# Patient Record
Sex: Female | Born: 1950 | Race: White | Hispanic: No | Smoking: Never smoker
Health system: Southern US, Community
[De-identification: ages and names within clinical notes are randomized; demographics above are authoritative.]

## PROBLEM LIST (undated history)

## (undated) DIAGNOSIS — R413 Other amnesia: Secondary | ICD-10-CM

## (undated) HISTORY — DX: Other amnesia: R41.3

---

## 2011-01-29 ENCOUNTER — Emergency Department (INDEPENDENT_AMBULATORY_CARE_PROVIDER_SITE_OTHER): Payer: No Typology Code available for payment source

## 2011-01-29 ENCOUNTER — Emergency Department (HOSPITAL_BASED_OUTPATIENT_CLINIC_OR_DEPARTMENT_OTHER)
Admission: EM | Admit: 2011-01-29 | Discharge: 2011-01-30 | Disposition: A | Discharge status: Home / Self Care | Payer: Self-pay | Attending: Emergency Medicine | Admitting: Emergency Medicine

## 2011-01-29 DIAGNOSIS — S139XXA Sprain of joints and ligaments of unspecified parts of neck, initial encounter: Secondary | ICD-10-CM | POA: Insufficient documentation

## 2011-01-29 DIAGNOSIS — M542 Cervicalgia: Secondary | ICD-10-CM | POA: Insufficient documentation

## 2011-01-29 DIAGNOSIS — M5030 Other cervical disc degeneration, unspecified cervical region: Secondary | ICD-10-CM

## 2011-01-29 DIAGNOSIS — Y92410 Unspecified street and highway as the place of occurrence of the external cause: Secondary | ICD-10-CM | POA: Insufficient documentation

## 2011-01-29 DIAGNOSIS — M47812 Spondylosis without myelopathy or radiculopathy, cervical region: Secondary | ICD-10-CM

## 2012-09-16 ENCOUNTER — Other Ambulatory Visit: Payer: Self-pay | Admitting: Family Medicine

## 2012-09-16 ENCOUNTER — Other Ambulatory Visit (HOSPITAL_COMMUNITY)
Admission: RE | Admit: 2012-09-16 | Discharge: 2012-09-16 | Disposition: A | Discharge status: Home / Self Care | Payer: BC Managed Care – PPO | Source: Ambulatory Visit | Attending: Family Medicine | Admitting: Family Medicine

## 2012-09-16 DIAGNOSIS — Z113 Encounter for screening for infections with a predominantly sexual mode of transmission: Secondary | ICD-10-CM | POA: Insufficient documentation

## 2012-09-16 DIAGNOSIS — Z124 Encounter for screening for malignant neoplasm of cervix: Secondary | ICD-10-CM | POA: Insufficient documentation

## 2012-09-16 DIAGNOSIS — N760 Acute vaginitis: Secondary | ICD-10-CM | POA: Insufficient documentation

## 2012-12-05 ENCOUNTER — Other Ambulatory Visit: Payer: Self-pay | Admitting: Family Medicine

## 2012-12-05 DIAGNOSIS — Z1231 Encounter for screening mammogram for malignant neoplasm of breast: Secondary | ICD-10-CM

## 2013-01-11 ENCOUNTER — Ambulatory Visit: Payer: BC Managed Care – PPO

## 2013-01-18 ENCOUNTER — Ambulatory Visit
Admission: RE | Admit: 2013-01-18 | Discharge: 2013-01-18 | Disposition: A | Discharge status: Home / Self Care | Payer: BC Managed Care – PPO | Source: Ambulatory Visit | Attending: Family Medicine | Admitting: Family Medicine

## 2013-01-18 DIAGNOSIS — Z1231 Encounter for screening mammogram for malignant neoplasm of breast: Secondary | ICD-10-CM

## 2013-12-11 ENCOUNTER — Other Ambulatory Visit: Payer: Self-pay | Admitting: Family Medicine

## 2013-12-11 ENCOUNTER — Other Ambulatory Visit (HOSPITAL_COMMUNITY)
Admission: RE | Admit: 2013-12-11 | Discharge: 2013-12-11 | Disposition: A | Discharge status: Home / Self Care | Payer: BC Managed Care – PPO | Source: Ambulatory Visit | Attending: Family Medicine | Admitting: Family Medicine

## 2013-12-11 DIAGNOSIS — Z01419 Encounter for gynecological examination (general) (routine) without abnormal findings: Secondary | ICD-10-CM | POA: Insufficient documentation

## 2014-01-17 ENCOUNTER — Telehealth: Payer: Self-pay | Admitting: Neurology

## 2014-01-17 ENCOUNTER — Encounter: Payer: Self-pay | Admitting: Neurology

## 2014-01-17 ENCOUNTER — Ambulatory Visit (INDEPENDENT_AMBULATORY_CARE_PROVIDER_SITE_OTHER): Payer: BC Managed Care – PPO | Admitting: Neurology

## 2014-01-17 VITALS — BP 112/70 | HR 73 | Ht 60.0 in | Wt 105.1 lb

## 2014-01-17 DIAGNOSIS — R413 Other amnesia: Secondary | ICD-10-CM
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
## 2014-01-17 NOTE — Telephone Encounter (Signed)
Pt was seen today and would like to know if there is a support group for Alzheimers please call her at [phone] and she states that you can leave  Message for her if she does not answer

## 2014-01-17 NOTE — Patient Instructions (Addendum)
1. Continue Aricept 10mg  daily 2. We will obtain disc of MRI brain and bloodwork done by your previous neurologist 3. Consider urology evaluation 4. Physical and brain stimulation exercises for brain health 5. Social work consult 6. Ref to [facility] Rehab for neuropsychological evaluation

## 2014-01-18 NOTE — Telephone Encounter (Signed)
Information and pamphlet mailed to patient

## 2014-04-02 DIAGNOSIS — R413 Other amnesia: Secondary | ICD-10-CM | POA: Diagnosis not present

## 2014-04-16 ENCOUNTER — Encounter: Payer: Self-pay | Admitting: Neurology

## 2014-04-16 ENCOUNTER — Ambulatory Visit (INDEPENDENT_AMBULATORY_CARE_PROVIDER_SITE_OTHER): Payer: BC Managed Care – PPO | Admitting: Neurology

## 2014-04-16 VITALS — BP 132/72 | HR 74 | Ht 60.0 in | Wt 102.0 lb

## 2014-04-16 DIAGNOSIS — F0390 Unspecified dementia without behavioral disturbance: Secondary | ICD-10-CM

## 2014-04-16 DIAGNOSIS — F03A0 Unspecified dementia, mild, without behavioral disturbance, psychotic disturbance, mood disturbance, and anxiety: Secondary | ICD-10-CM | POA: Insufficient documentation

## 2014-04-16 NOTE — Patient Instructions (Signed)
1. Continue Aricept 10mg  daily 2. Physical and brain stimulation exercises are important for brain health 3. Have open communication lines with family 4. Follow-up 6 months
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
## 2014-07-12 ENCOUNTER — Other Ambulatory Visit: Payer: Self-pay

## 2014-07-12 DIAGNOSIS — Z1231 Encounter for screening mammogram for malignant neoplasm of breast: Secondary | ICD-10-CM

## 2014-07-30 ENCOUNTER — Ambulatory Visit
Admission: RE | Admit: 2014-07-30 | Discharge: 2014-07-30 | Disposition: A | Discharge status: Home / Self Care | Payer: BC Managed Care – PPO | Source: Ambulatory Visit

## 2014-07-30 DIAGNOSIS — Z1231 Encounter for screening mammogram for malignant neoplasm of breast: Secondary | ICD-10-CM

## 2014-10-15 ENCOUNTER — Ambulatory Visit: Payer: BC Managed Care – PPO | Admitting: Neurology

## 2014-10-23 ENCOUNTER — Ambulatory Visit (INDEPENDENT_AMBULATORY_CARE_PROVIDER_SITE_OTHER): Payer: 59 | Admitting: Neurology

## 2014-10-23 ENCOUNTER — Encounter: Payer: Self-pay | Admitting: Neurology

## 2014-10-23 VITALS — BP 122/82 | HR 68 | Resp 16 | Ht 60.0 in | Wt 107.0 lb

## 2014-10-23 DIAGNOSIS — F03B18 Unspecified dementia, moderate, with other behavioral disturbance: Secondary | ICD-10-CM

## 2014-10-23 DIAGNOSIS — F0391 Unspecified dementia with behavioral disturbance: Secondary | ICD-10-CM

## 2014-10-23 MED ORDER — MEMANTINE HCL ER 7 & 14 & 21 &28 MG PO CP24: ORAL_CAPSULE | ORAL | Status: DC

## 2014-10-23 NOTE — Patient Instructions (Signed)
1. Continue Aricept 2. Start Namenda XR starter pack, call our office for any problems 3. Discuss Education officer, museum with your Family Doctor
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
## 2014-10-29 ENCOUNTER — Encounter: Payer: Self-pay | Admitting: Neurology

## 2015-08-21 ENCOUNTER — Ambulatory Visit
Admission: RE | Admit: 2015-08-21 | Discharge: 2015-08-21 | Disposition: A | Discharge status: Home / Self Care | Payer: Medicare Other | Source: Ambulatory Visit | Attending: Family Medicine | Admitting: Family Medicine

## 2015-08-21 ENCOUNTER — Other Ambulatory Visit: Payer: Self-pay | Admitting: Family Medicine

## 2015-08-21 DIAGNOSIS — R52 Pain, unspecified: Secondary | ICD-10-CM

## 2015-09-09 ENCOUNTER — Ambulatory Visit: Payer: Medicare Other

## 2015-09-12 ENCOUNTER — Ambulatory Visit: Payer: PPO | Attending: Family Medicine

## 2015-09-12 ENCOUNTER — Ambulatory Visit: Payer: Medicare Other

## 2015-09-12 DIAGNOSIS — M546 Pain in thoracic spine: Secondary | ICD-10-CM | POA: Diagnosis not present

## 2015-09-12 DIAGNOSIS — R293 Abnormal posture: Secondary | ICD-10-CM | POA: Diagnosis not present

## 2015-09-12 DIAGNOSIS — M545 Low back pain, unspecified: Secondary | ICD-10-CM

## 2015-09-12 DIAGNOSIS — R262 Difficulty in walking, not elsewhere classified: Secondary | ICD-10-CM

## 2015-09-12 NOTE — Therapy (Signed)
[facility] [address], [place], [place], [address] Phone: [phone]   Fax:  [phone]  Physical Therapy Evaluation  Patient Details  Name: Colleen Lamb MRN: [id number] Date of Birth: Apr 03, 1951 Referring Provider: Cari Caraway, MD  Encounter Date: 09/12/2015      PT End of Session - 09/12/15 1142    Visit Number 1   Number of Visits 10   Date for PT Re-Evaluation 11/07/15   PT Start Time [id number]   PT Stop Time 1143   PT Time Calculation (min) 38 min   Activity Tolerance Patient tolerated treatment well   Behavior During Therapy [facility] for tasks assessed/performed      Past Medical History  Diagnosis Date  . Memory loss     History reviewed. No pertinent past surgical history.  There were no vitals filed for this visit.  Visit Diagnosis:  Bilateral thoracic back pain - Plan: PT plan of care cert/re-cert  Bilateral low back pain without sciatica - Plan: PT plan of care cert/re-cert  Difficulty walking - Plan: PT plan of care cert/re-cert  Posture abnormality - Plan: PT plan of care cert/re-cert      Subjective Assessment - 09/12/15 1140    Subjective              [facility] PT Assessment - 09/12/15 0001    Assessment   Medical Diagnosis thoracic back pain (M54.6)   Referring Provider Cari Caraway, MD   Onset Date/Surgical Date 08/12/15   Prior Therapy none   Precautions   Precautions Other (comment);Fall  osteporosis-no joint mobs, dementia-memory loss   Restrictions   Weight Bearing Restrictions No   Balance Screen   Has the patient fallen in the past 6 months No   Has the patient had a decrease in activity level because of a fear of falling?  No   Is the patient reluctant to leave their home because of a fear of falling?  No   Home Environment   Living Environment Private residence   [place] Access Level entry   [place] One level   Prior Function    Level of [place] Retired   Leisure none   Cognition   Overall Cognitive Status History of cognitive impairments - at baseline   Memory Impaired   Observation/Other Assessments   Focus on Therapeutic Outcomes (FOTO)  66% limitation   Posture/Postural Control   Posture/Postural Control Postural limitations   Postural Limitations Rounded Shoulders;Forward head   ROM / Strength   AROM / PROM / Strength AROM;PROM;Strength   AROM   Overall AROM  Deficits   Overall AROM Comments Lumbar AROM limited by 25% with pain reported in the lower thoracic spine, cervical AROM is limited by 25% into rotation and 50% limitation in flexion and sidebending.  Pt reports upper thoracic pain with end range of all cervical AROM.  UE AROM limited by 25% bilaterally.   PROM   Overall PROM  Deficits   Overall PROM Comments bil. hip flexibility limited by 25% in all driections   Strength   Overall Strength Deficits   Overall Strength Comments UE strength is 4 to 4+/5 and LE strength 4/5 throughout.   Palpation   Spinal mobility not able to assess in prone as pt not able to tolerate this position   Palpation comment tension and active trigger points in bil. UT and bil. thoracic parapsinals T2-5   Transfers  Transfers Sit to Stand;Stand to Sit   Sit to Stand 6: Modified independent (Device/Increase time);With upper extremity assist;With armrests   Ambulation/Gait   Ambulation/Gait Yes   Ambulation/Gait Assistance 6: Modified independent (Device/Increase time)   Ambulation Distance (Feet) 100 Feet   Gait Pattern Step-through pattern;Decreased trunk rotation;Trunk flexed   Ambulation Surface Level   Gait Comments instability upon standing and slow gait velocity                           PT Education - 09/12/15 1135    Education provided Yes   Education Details HEP: cervical AROM and posture education   Person(s) Educated Patient;Other (comment)   Methods  Explanation;Demonstration;Handout   Comprehension Verbalized understanding;Returned demonstration          PT Short Term Goals - 09/12/15 1153    PT SHORT TERM GOAL #1   Title be independent in initial HEP   Time 4   Period Weeks   Status New   PT SHORT TERM GOAL #2   Title verbalize and demonstrate understanding of dos and dont's of osteoporosis   Time 4   Period Weeks   Status New   PT SHORT TERM GOAL #3   Title report a 25% reduction in LBP/thoracic pain with standing and walking   Time 4   Period Weeks   Status New           PT Long Term Goals - 09/12/15 1100    PT LONG TERM GOAL #1   Title be independent in advanced HEP   Time 8   Period Weeks   Status New   PT LONG TERM GOAL #2   Title reduce FOTO to < or = to 45% limitation   Time 8   Period Weeks   Status New   PT LONG TERM GOAL #3   Title reduce pain to allow for walking for 15 minutes to improve community independence   Time 8   Period Weeks   Status New   PT LONG TERM GOAL #4   Title report a 50% reduction in LBP and thoracic pain with standing and walking   Time 8   Period Weeks   Status New   PT LONG TERM GOAL #5   Title sleep without interruption due to pain   Time 8   Period Weeks   Status New   Additional Long Term Goals   Additional Long Term Goals Yes   PT LONG TERM GOAL #6   Title demonstrate stability upon standing to improve safety with ambulation   Time 8   Period Weeks   Status New               Plan - 09/12/15 1148    Clinical Impression Statement Pt presents to PT with one month history of thoracic/lumbar pain that began without cause.  Pt has osteoporosis with t score -3.0.  Pt demonstrates poor posture, limited standing and walking due to pain, limited cervical and lumbar AROM and UE/LE weakness throughout. Pt also demonstrates instabiltiy with gait on level surface. Pt will benefit from skilled PT for osteporosis education, balance training, postural strength and  manual/modalities as needed for pain.     Pt will benefit from skilled therapeutic intervention in order to improve on the following deficits Abnormal gait;Decreased range of motion;Pain;Postural dysfunction;Decreased strength;Decreased mobility;Decreased balance;Impaired flexibility;Improper body mechanics;Decreased activity tolerance;Decreased endurance   Rehab Potential Good   PT Frequency 2x /  week   PT Duration 8 weeks   PT Treatment/Interventions ADLs/Self Care Home Management;Cryotherapy;Electrical Stimulation;Moist Heat;Therapeutic exercise;Therapeutic activities;Functional mobility training;Stair training;Ultrasound;Neuromuscular re-education;Patient/family education;Manual techniques;Taping;Dry needling;Passive range of motion   PT Next Visit Plan osteoporosis education/body mechanics, modalities for pain, postural strength, test TUG or Berg for balance assessment. Balance training if MD signs iniital evaluation.   Recommended Other Services Balance training    Consulted and Agree with Plan of Care Patient;Family member/caregiver          G-Codes - 10-05-15 1100    Functional Assessment Tool Used FOTO 66% limitation   Functional Limitation Other PT primary   Other PT Primary Current Status [id number]) At least 60 percent but less than 80 percent impaired, limited or restricted   Other PT Primary Goal Status [id number]) At least 40 percent but less than 60 percent impaired, limited or restricted       Problem List Patient Active Problem List   Diagnosis Date Noted  . 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 Memory loss 01/17/2014    TAKACS,KELLY, PT Oct 05, 2015, 12:00 PM  [facility] [address], [place], [place], [address] Phone: [phone]   Fax:  [phone]  Name: Colleen Lamb MRN: [id number] Date of Birth: Aug 20, 1951

## 2015-09-12 NOTE — Patient Instructions (Signed)
PERFORM ALL EXERCISES GENTLY AND WITH GOOD POSTURE.    20 SECOND HOLD, 3 REPS TO EACH SIDE. 4-5 TIMES EACH DAY.   AROM: Neck Rotation   Turn head slowly to look over one shoulder, then the other.   AROM: Neck Flexion   Bend head forward.   AROM: Lateral Neck Flexion   Slowly tilt head toward one shoulder, then the other.    Posture - Standing   Good posture is important. Avoid slouching and forward head thrust. Maintain curve in low back and align ears over shoulders, hips over ankles.  Pull your belly button in toward your back bone. Posture Tips DO: - stand tall and erect - keep chin tucked in - keep head and shoulders in alignment - check posture regularly in mirror or large window - pull head back against headrest in car seat;  Change your position often.  Sit with lumbar support. DON'T: - slouch or slump while watching TV or reading - sit, stand or lie in one position  for too long;  Sitting is especially hard on the spine so if you sit at a desk/use the computer, then stand up often! Copyright  VHI. All rights reserved.  Posture - Sitting  Sit upright, head facing forward. Try using a roll to support lower back. Keep shoulders relaxed, and avoid rounded back. Keep hips level with knees. Avoid crossing legs for long periods. Copyright  VHI. All rights reserved.  Chronic neck strain can develop because of poor posture and faulty work habits  Postural strain related to slumped sitting and forward head posture is a leading cause of headaches, neck and upper back pain  General strengthening and flexibility exercises are helpful in the treatment of neck pain.  Most importantly, you should learn to correct the posture that may be contributing to chronic pain.   Change positions frequently  Change your work or home environment to improve posture and mechanics.   [facility] [address], [address] [place], [place] [address] Phone # [phone] Fax  [phone] 

## 2015-09-16 ENCOUNTER — Ambulatory Visit: Payer: PPO

## 2015-09-16 DIAGNOSIS — R293 Abnormal posture: Secondary | ICD-10-CM

## 2015-09-16 DIAGNOSIS — M545 Low back pain, unspecified: Secondary | ICD-10-CM

## 2015-09-16 DIAGNOSIS — R262 Difficulty in walking, not elsewhere classified: Secondary | ICD-10-CM

## 2015-09-16 DIAGNOSIS — M546 Pain in thoracic spine: Secondary | ICD-10-CM

## 2015-09-16 NOTE — Patient Instructions (Signed)
DO's and DON'T's   Avoid and/or Minimize positions of forward bending ( flexion)  Side bending and rotation of the trunk  Especially when movements occur together   When your back aches:   Don't sit down   Lie down on your back with a small pillow under your head and one under your knees or as outlined by our therapist. Or, lie in the 90/90 position ( on the floor with your feet and legs on the sofa with knees and hips bent to 90 degrees)  Tying or putting on your shoes:   Don't bend over to tie your shoes or put on socks.  Instead, bring one foot up, cross it over the opposite knee and bend forward (hinge) at the hips to so the task.  Keep your back straight.  If you cannot do this safely, then you need to use long handled assistive devices such as a shoehorn and sock puller.  Exercising:  Don't engage in ballistic types of exercise routines such as high-impact aerobics or jumping rope  Don't do exercises in the gym that bring you forward (abdominal crunches, sit-ups, touching your  toes, knee-to-chest, straight leg raising.)  Follow a regular exercise program that includes a variety of different weight-bearing activities, such as low-impact aerobics, T' ai chi or walking as your physical therapist advises  Do exercises that emphasize return to normal body alignment and strengthening of the muscles that keep your back straight, as outlined in this program or by your therapist  Household tasks:  Don't reach unnecessarily or twist your trunk when mopping, sweeping, vacuuming, raking, making beds, weeding gardens, getting objects ou of cupboards, etc.  Keep your broom, mop, vacuum, or rake close to you and mover your whole body as you move them. Walk over to the area on which you are working. Arrange kitchen, bathroom, and bedroom shelves so that frequently used items may be reached without excessive bending, twisting, and reaching.  Use a  sturdy stool if necessary.  Don't bend from the waist to pick up something up  Off the floor, out of the trunk of your car, or to brush your teeth, wash your face, etc.   Bend at the knees, keeping back straight as possible. Use a reacher if necessary.   Prevention of fracture is the so-called "BOTTOm -Line" in the management of OSTEOPOROSIS. Do not take unnecessary chances in movement. Once a compression fracture occurs, the process is very difficult to control; one fracture is frequently followed by many more.      Lifting Principles  .Maintain proper posture and head alignment. .Slide object as close as possible before lifting. .Move obstacles out of the way. .Test before lifting; ask for help if too heavy. .Tighten stomach muscles without holding breath. .Use smooth movements; do not jerk. .Use legs to do the work, and pivot with feet. .Distribute the work load symmetrically and close to the center of trunk. .Push instead of pull whenever possible.   Squat down and hold basket close to stand. Use leg muscles to do the work.    Avoid twisting or bending back. Pivot around using foot movements, and bend at knees if needed when reaching for articles.        Getting Into / Out of Bed   Lower self to lie down on one side by raising legs and lowering head at the same time. Use arms to assist moving without twisting. Bend both knees to roll onto back if desired. To sit up,   start from lying on side, and use same move-ments in reverse. Keep trunk aligned with legs.    Shift weight from front foot to back foot as item is lifted off shelf.    When leaning forward to pick object up from floor, extend one leg out behind. Keep back straight. Hold onto a sturdy support with other hand.      Sit upright, head facing forward. Try using a roll to support lower back. Keep shoulders relaxed, and avoid rounded back. Keep hips level with knees. Avoid crossing legs for long  periods.     [facility] [address], [address] [place], [place] [address] Phone # [phone] Fax [phone] 

## 2015-09-16 NOTE — Therapy (Signed)
[facility] [address], [place], [place], [address] Phone: [phone]   Fax:  [phone]  Physical Therapy Treatment  Patient Details  Name: Colleen Lamb MRN: [id number] Date of Birth: [birth year] Referring Provider: Cari Caraway, MD  Encounter Date: 09/16/2015      PT End of Session - 09/16/15 1437    Visit Number 2   Number of Visits 10   Date for PT Re-Evaluation 11/07/15   PT Start Time [id number]   PT Stop Time 1441   PT Time Calculation (min) 42 min   Activity Tolerance Patient tolerated treatment well   Behavior During Therapy [facility] for tasks assessed/performed;Flat affect      Past Medical History  Diagnosis Date  . Memory loss     History reviewed. No pertinent past surgical history.  There were no vitals filed for this visit.  Visit Diagnosis:  Bilateral thoracic back pain  Bilateral low back pain without sciatica  Difficulty walking  Posture abnormality      Subjective Assessment - 09/16/15 1400    Subjective Pt reports that she is trying to mover her shoulders around during the day.  She reports trying to stretch her neck.     Patient is accompained by: Family member   Pertinent History osteoporosis: t score -3.0, dementia with difficulty with follow-through with HEP and with following instructions in the clinic   Currently in Pain? Yes   Pain Score 5    Pain Location Back   Pain Orientation Right;Left;Upper;Mid;Lower   Pain Descriptors / Indicators Aching;Dull   Pain Type Acute pain   Pain Onset 1 to 4 weeks ago   Pain Frequency Constant   Aggravating Factors  sitting too long, standing and walking   Pain Relieving Factors pain medication, rest, change of position                         [facility] Adult PT Treatment/Exercise - 09/16/15 0001    Exercises   Exercises Neck;Shoulder;Lumbar   Neck Exercises: Seated   Shoulder Rolls 20 reps;Backwards;Forwards   Other  Seated Exercise Cervical AROM in 3 directions 3x20 seconds  tactile cues and demo cues for posture and scapular depressi   Neck Exercises: Supine   Other Supine Exercise decompression position x 3 minutes, then scap presses into the mat 2x10   Lumbar Exercises: Aerobic   Stationary Bike NuStep Level 1x 6 minutes  seat 6, arms 8   Shoulder Exercises: ROM/Strengthening   UBE (Upper Arm Bike) Level 0 x 4 minutes (2/2)                PT Education - 09/16/15 1414    Education provided Yes   Education Details osteo dos and donts, Customer service manager) Educated Patient   Methods Explanation;Demonstration;Handout   Comprehension Verbalized understanding;Returned demonstration          PT Short Term Goals - 09/16/15 1415    PT SHORT TERM GOAL #1   Title be independent in initial HEP   Time 4   Period Weeks   Status On-going  tactile and demo cues still required due to dementia   PT SHORT TERM GOAL #2   Title verbalize and demonstrate understanding of dos and dont's of osteoporosis   Time 4   Period Weeks   Status On-going  education received today, further education needed to determine if retained   PT SHORT TERM GOAL #3  Title report a 25% reduction in LBP/thoracic pain with standing and walking   Time 4   Period Weeks   Status On-going           PT Long Term Goals - 09/12/15 1100    PT LONG TERM GOAL #1   Title be independent in advanced HEP   Time 8   Period Weeks   Status New   PT LONG TERM GOAL #2   Title reduce FOTO to < or = to 45% limitation   Time 8   Period Weeks   Status New   PT LONG TERM GOAL #3   Title reduce pain to allow for walking for 15 minutes to improve community independence   Time 8   Period Weeks   Status New   PT LONG TERM GOAL #4   Title report a 50% reduction in LBP and thoracic pain with standing and walking   Time 8   Period Weeks   Status New   PT LONG TERM GOAL #5   Title sleep without interruption due to pain    Time 8   Period Weeks   Status New   Additional Long Term Goals   Additional Long Term Goals Yes   PT LONG TERM GOAL #6   Title demonstrate stability upon standing to improve safety with ambulation   Time 8   Period Weeks   Status New               Plan - 09/16/15 1403    Clinical Impression Statement Pt with only 1 session after evaluation,  Pt with postural abnormality and has received education regarding posture modifcations.  Pt with limited standing and walking due to lumbar/thoracic pain and limited cervical and lumbar AROM and UE/LE weakness througout.  Pt with osteoporosis and will benefit from skilled PT for osteoporosis education, balance training and postural strength and manual/modaliteis as needed for pain.  Pt with difficulty with following verbal cues in the clinic due to dementia.     Pt will benefit from skilled therapeutic intervention in order to improve on the following deficits Abnormal gait;Decreased range of motion;Pain;Postural dysfunction;Decreased strength;Decreased mobility;Decreased balance;Impaired flexibility;Improper body mechanics;Decreased activity tolerance;Decreased endurance   Rehab Potential Good   PT Frequency 2x / week   PT Duration 8 weeks   PT Treatment/Interventions ADLs/Self Care Home Management;Cryotherapy;Electrical Stimulation;Moist Heat;Therapeutic exercise;Therapeutic activities;Functional mobility training;Stair training;Ultrasound;Neuromuscular re-education;Patient/family education;Manual techniques;Taping;Dry needling;Passive range of motion   PT Next Visit Plan modalities for pain, postural strength, test TUG or Berg for balance assessment. Balance training if MD signs iniital evaluation.   Consulted and Agree with Plan of Care Patient        Problem List Patient Active Problem List   Diagnosis Date Noted  . 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 Memory loss 01/17/2014    Cleophus Mendonsa, PT 09/16/2015, 2:38 PM  [facility] [address] [place], [place], [address] Phone: [phone]   Fax:  [phone]  Name: Colleen Lamb MRN: [id number] Date of Birth: 08/08/1951

## 2015-09-19 ENCOUNTER — Other Ambulatory Visit: Payer: Self-pay

## 2015-09-19 ENCOUNTER — Encounter: Payer: Self-pay | Admitting: Physical Therapy

## 2015-09-19 ENCOUNTER — Ambulatory Visit: Payer: PPO | Admitting: Physical Therapy

## 2015-09-19 DIAGNOSIS — M545 Low back pain, unspecified: Secondary | ICD-10-CM

## 2015-09-19 DIAGNOSIS — Z1231 Encounter for screening mammogram for malignant neoplasm of breast: Secondary | ICD-10-CM

## 2015-09-19 DIAGNOSIS — M546 Pain in thoracic spine: Secondary | ICD-10-CM | POA: Diagnosis not present

## 2015-09-19 DIAGNOSIS — R262 Difficulty in walking, not elsewhere classified: Secondary | ICD-10-CM

## 2015-09-19 DIAGNOSIS — R293 Abnormal posture: Secondary | ICD-10-CM

## 2015-09-19 NOTE — Therapy (Signed)
[facility] [address], [place], [place], [address] Phone: [phone]   Fax:  [phone]  Physical Therapy Treatment  Patient Details  Name: Colleen Lamb MRN: [id number] Date of Birth: 1951-06-27 Referring Provider: Cari Caraway, MD  Encounter Date: 09/19/2015      PT End of Session - 09/19/15 0903    Visit Number 3   Number of Visits 10  Medicare   Date for PT Re-Evaluation 11/07/15   PT Start Time 0845   PT Stop Time 0925   PT Time Calculation (min) 40 min   Activity Tolerance Patient tolerated treatment well  patient needs guidance of where she is to go   Behavior During Therapy [facility] for tasks assessed/performed;Flat affect      Past Medical History  Diagnosis Date  . Memory loss     History reviewed. No pertinent past surgical history.  There were no vitals filed for this visit.  Visit Diagnosis:  Bilateral thoracic back pain  Bilateral low back pain without sciatica  Difficulty walking  Posture abnormality      Subjective Assessment - 09/19/15 0858    Subjective Pain hurts. It is very uncomfortable.    Patient is accompained by: Family member  sister   Pertinent History osteoporosis: t score -3.0, dementia with difficulty with follow-through with HEP and with following instructions in the clinic   Limitations Sitting;Standing;Walking   How long can you sit comfortably? 10 minutes   How long can you stand comfortably? 5 minutes   How long can you walk comfortably? 5 minutes   Diagnostic tests x-ray: no fracture (08/21/15)   Patient Stated Goals reduce pain, stand and walk without limitaiton   Currently in Pain? Yes   Pain Score 5    Pain Location Back   Pain Orientation Upper;Mid;Lower   Pain Descriptors / Indicators Aching;Dull   Pain Type Acute pain   Pain Onset 1 to 4 weeks ago   Pain Frequency Constant   Aggravating Factors  sitting too long, standing and walking   Pain  Relieving Factors pain medication, rest, change of position                         [facility] Adult PT Treatment/Exercise - 09/19/15 0001    Neck Exercises: Supine   Other Supine Exercise decompression position x 3 minutes, then scap presses into the mat 2x10   Lumbar Exercises: Aerobic   Stationary Bike NuStep Level 1x 6 minutes  seat 6, arms 8   Manual Therapy   Manual Therapy Soft tissue mobilization   Soft tissue mobilization gentle soft tissue work to thoracic and lumbar paraapinals in prone                PT Education - 09/19/15 0915    Education provided Yes   Education Details decompression exercise series   Person(s) Educated Patient   Methods Explanation;Demonstration;Tactile cues;Verbal cues;Handout   Comprehension Returned demonstration;Verbalized understanding;Verbal cues required;Tactile cues required  dementia          PT Short Term Goals - 09/16/15 1415    PT SHORT TERM GOAL #1   Title be independent in initial HEP   Time 4   Period Weeks   Status On-going  tactile and demo cues still required due to dementia   PT SHORT TERM GOAL #2   Title verbalize and demonstrate understanding of dos and dont's of osteoporosis   Time 4  Period Weeks   Status On-going  education received today, further education needed to determine if retained   PT SHORT TERM GOAL #3   Title report a 25% reduction in LBP/thoracic pain with standing and walking   Time 4   Period Weeks   Status On-going           PT Long Term Goals - 09/12/15 1100    PT LONG TERM GOAL #1   Title be independent in advanced HEP   Time 8   Period Weeks   Status New   PT LONG TERM GOAL #2   Title reduce FOTO to < or = to 45% limitation   Time 8   Period Weeks   Status New   PT LONG TERM GOAL #3   Title reduce pain to allow for walking for 15 minutes to improve community independence   Time 8   Period Weeks   Status New   PT LONG TERM GOAL #4   Title report a 50%  reduction in LBP and thoracic pain with standing and walking   Time 8   Period Weeks   Status New   PT LONG TERM GOAL #5   Title sleep without interruption due to pain   Time 8   Period Weeks   Status New   Additional Long Term Goals   Additional Long Term Goals Yes   PT LONG TERM GOAL #6   Title demonstrate stability upon standing to improve safety with ambulation   Time 8   Period Weeks   Status New               Plan - 09/19/15 0929    Clinical Impression Statement Patient needs verbal and tactile cues with HEP due to her dementia.  Patient needs exercises printed big due to difficulty seeing.  Patient has learned decompression exercises for home and her sister will assist her. Patient has difficulty with following verbal cues due to dementia.    Pt will benefit from skilled therapeutic intervention in order to improve on the following deficits Abnormal gait;Decreased range of motion;Pain;Postural dysfunction;Decreased strength;Decreased mobility;Decreased balance;Impaired flexibility;Improper body mechanics;Decreased activity tolerance;Decreased endurance   Rehab Potential Good   PT Frequency 2x / week   PT Treatment/Interventions ADLs/Self Care Home Management;Cryotherapy;Electrical Stimulation;Moist Heat;Therapeutic exercise;Therapeutic activities;Functional mobility training;Stair training;Ultrasound;Neuromuscular re-education;Patient/family education;Manual techniques;Taping;Dry needling;Passive range of motion   PT Next Visit Plan modalities for pain, postural strength, test TUG or Berg for balance assessment. Balance training if MD signs iniital evaluation.   PT Home Exercise Plan review decompression exercises   Consulted and Agree with Plan of Care Patient        Problem List Patient Active Problem List   Diagnosis Date Noted  . 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 Memory loss 01/17/2014    Earlie Counts, PT 09/19/2015 9:32 AM   [facility] [address] [place], [place], [address] Phone: [phone]   Fax:  [phone]  Name: Colleen Lamb MRN: [id number] Date of Birth: 1951/06/20

## 2015-09-19 NOTE — Patient Instructions (Signed)
  Decompression Exercise: Arm Support    Lie on back on firm surface, knees bent, feet flat, arms turned up, out to sides, backs of hands down. Support under arms: towel. Time 2___ minutes. Surface: floor   Copyright  VHI. All rights reserved.   Head Press With [place] chin SLIGHTLY toward chest, keep mouth closed. Feel weight on back of head. Increase weight by pressing head down. Hold _3__ seconds. Relax. Repeat _3__ times. Surface: floor   Copyright  VHI. All rights reserved.  Shoulder Press    Press both shoulders down. Hold _3__ seconds. Repeat __3_ times. Press one shoulder down. Hold 3___ seconds Repeat __3_ times. Do other shoulder. If unable to press one or both shoulders, lie in position a few sessions until you can. Surface: floor   Copyright  VHI. All rights reserved.  Leg Straightener / Heel Extender    Straighten one leg down. Pull toes AND forefoot toward knee, extend heel. Hold foot position _3__ seconds. Relax the foot. Repeat 1 time. Re-bend knee. Do other leg. Each leg _3__ times. Surface: floor   Copyright  VHI. All rights reserved.  Leg Lengthener: Full    Straighten one leg. Pull toes AND forefoot toward knee, extend heel. Lengthen leg by pulling pelvis away from ribs. Hold _3__ seconds. Relax. Repeat 1 time. Re-bend knee. Do other leg. Each leg _3__ times. Surface: floor   Copyright  VHI. All rights reserved.  RE-ALIGNMENT Tips  BENEFITS: 1.It helps to re-align the curves of the back and improve standing posture. 2.It allows the back muscles to rest and strengthen in preparation for more activity. FREQUENCY: Daily, even after weeks, months and years of more advanced exercises. START: 1.All exercises start in the same position: lying on the back, arms resting on the supporting surface, palms up and slightly away from the body, backs of hands down, knees bent, feet flat. 2.The head, neck, arms, and legs are supported according to  specific instructions of your therapist.  Copyright  VHI. All rights reserved.  [facility] [address], [place], [place] [address] Phone # [phone] Fax [phone]

## 2015-09-23 ENCOUNTER — Ambulatory Visit: Payer: PPO | Admitting: Physical Therapy

## 2015-09-23 ENCOUNTER — Encounter: Payer: Self-pay | Admitting: Physical Therapy

## 2015-09-23 DIAGNOSIS — R293 Abnormal posture: Secondary | ICD-10-CM

## 2015-09-23 DIAGNOSIS — M546 Pain in thoracic spine: Secondary | ICD-10-CM

## 2015-09-23 DIAGNOSIS — M545 Low back pain, unspecified: Secondary | ICD-10-CM

## 2015-09-23 NOTE — Therapy (Signed)
[facility] [address], [place], [place], [address] Phone: [phone]   Fax:  [phone]  Physical Therapy Treatment  Patient Details  Name: Colleen Lamb MRN: [id number] Date of Birth: April 12, 1951 Referring Provider: Cari Caraway, MD  Encounter Date: 09/23/2015      PT End of Session - 09/23/15 0945    Visit Number 4   Number of Visits 10  medicare   PT Start Time 0932   PT Stop Time 5379   PT Time Calculation (min) 60 min   Activity Tolerance Patient tolerated treatment well  patient needs verbal cues and guidance on exercise and where she is to go due to dementia   Behavior During Therapy [facility] for tasks assessed/performed;Flat affect      Past Medical History  Diagnosis Date  . Memory loss     History reviewed. No pertinent past surgical history.  There were no vitals filed for this visit.  Visit Diagnosis:  Bilateral thoracic back pain  Bilateral low back pain without sciatica  Posture abnormality      Subjective Assessment - 09/23/15 0940    Subjective I was a little sore. Sometimes my back feels better; Hard to assesss pain due to dementia   Patient is accompained by: Family member  sister   Pertinent History osteoporosis: t score -3.0, dementia with difficulty with follow-through with HEP and with following instructions in the clinic   Limitations Sitting;Standing;Walking   How long can you sit comfortably? 10 minutes   How long can you stand comfortably? 5 minutes   How long can you walk comfortably? 5 minutes   Diagnostic tests x-ray: no fracture (08/21/15)   Patient Stated Goals reduce pain, stand and walk without limitaiton   Currently in Pain? Yes   Pain Score 5    Pain Location Back   Pain Orientation Mid;Lower;Upper   Pain Descriptors / Indicators Aching;Dull   Pain Type Acute pain   Pain Onset 1 to 4 weeks ago   Pain Frequency Constant   Aggravating Factors  sitting too  long, standing and walking   Pain Relieving Factors pain medication, rest, change of position   Multiple Pain Sites No                         [facility] Adult PT Treatment/Exercise - 09/23/15 0001    Neck Exercises: Supine   Shoulder Flexion Both;10 reps  verbal cues on performing correctly   Shoulder ABduction Both;10 reps  horizontal; therapist counts for patient   Other Supine Exercise decompression position x 3 minutes, then scap presses into the mat 2x10  verbal cues to perform correctly   Lumbar Exercises: Aerobic   Stationary Bike NuStep Level 1x 6 minutes  seat 6, arms 8   Lumbar Exercises: Supine   Bent Knee Raise 10 reps  alternate hip flexion   Modalities   Modalities Electrical Stimulation;Moist Heat   Moist Heat Therapy   Number Minutes Moist Heat 15 Minutes   Moist Heat Location Lumbar Spine  supine   Electrical Stimulation   Electrical Stimulation Location lumbar supine   Electrical Stimulation Action IFC   Electrical Stimulation Parameters to patient tolerance, 15 min   Electrical Stimulation Goals Pain   Manual Therapy   Manual Therapy Soft tissue mobilization   Soft tissue mobilization gentle soft tissue work to thoracic and lumbar paraapinals in prone  PT Short Term Goals - 09/23/15 1011    PT SHORT TERM GOAL #1   Title be independent in initial HEP   Time 4   Period Weeks   Status On-going  needs assistance due to dementia   PT SHORT TERM GOAL #2   Title verbalize and demonstrate understanding of dos and dont's of osteoporosis   Time 4   Period Weeks   Status On-going   PT SHORT TERM GOAL #3   Title report a 25% reduction in LBP/thoracic pain with standing and walking   Time 4   Period Weeks   Status On-going           PT Long Term Goals - 09/12/15 1100    PT LONG TERM GOAL #1   Title be independent in advanced HEP   Time 8   Period Weeks   Status New   PT LONG TERM GOAL #2   Title reduce FOTO  to < or = to 45% limitation   Time 8   Period Weeks   Status New   PT LONG TERM GOAL #3   Title reduce pain to allow for walking for 15 minutes to improve community independence   Time 8   Period Weeks   Status New   PT LONG TERM GOAL #4   Title report a 50% reduction in LBP and thoracic pain with standing and walking   Time 8   Period Weeks   Status New   PT LONG TERM GOAL #5   Title sleep without interruption due to pain   Time 8   Period Weeks   Status New   Additional Long Term Goals   Additional Long Term Goals Yes   PT LONG TERM GOAL #6   Title demonstrate stability upon standing to improve safety with ambulation   Time 8   Period Weeks   Status New               Plan - 09/23/15 1012    Clinical Impression Statement Patient needs verbal cues for exercises due to her dementia.  Patient needs direction to where she is to go to the gym. Patient has trouble laying down and needs guidance.  Patient has difficulty determining her pain due to dementia.  Patient needs tactile cues  for head press.  Patient hsa not met f=goals due to not being independent with HEP due  demential.    Pt will benefit from skilled therapeutic intervention in order to improve on the following deficits Abnormal gait;Decreased range of motion;Pain;Postural dysfunction;Decreased strength;Decreased mobility;Decreased balance;Impaired flexibility;Improper body mechanics;Decreased activity tolerance;Decreased endurance   Rehab Potential Good   Clinical Impairments Affecting Rehab Potential dementia   PT Frequency 2x / week   PT Duration 8 weeks   PT Treatment/Interventions ADLs/Self Care Home Management;Cryotherapy;Electrical Stimulation;Moist Heat;Therapeutic exercise;Therapeutic activities;Functional mobility training;Stair training;Ultrasound;Neuromuscular re-education;Patient/family education;Manual techniques;Taping;Dry needling;Passive range of motion   PT Next Visit Plan modalities for pain,  postural strength, test TUG or Berg for balance assessment. Balance training if MD signs iniital evaluation.   PT Home Exercise Plan progress as needed   Recommended Other Services Balance training   Consulted and Agree with Plan of Care Patient        Problem List Patient Active Problem List   Diagnosis Date Noted  . 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 Memory loss 01/17/2014    Earlie Counts, PT 09/23/2015 10:20 AM    [facility] [address], [address]  [place], [place], [address] Phone: [phone]   Fax:  [phone]  Name: Colleen Lamb MRN: [id number] Date of Birth: 1951/07/08

## 2015-09-26 ENCOUNTER — Ambulatory Visit: Payer: PPO

## 2015-09-26 DIAGNOSIS — R262 Difficulty in walking, not elsewhere classified: Secondary | ICD-10-CM

## 2015-09-26 DIAGNOSIS — M546 Pain in thoracic spine: Secondary | ICD-10-CM | POA: Diagnosis not present

## 2015-09-26 DIAGNOSIS — R293 Abnormal posture: Secondary | ICD-10-CM

## 2015-09-26 DIAGNOSIS — M545 Low back pain, unspecified: Secondary | ICD-10-CM

## 2015-09-26 NOTE — Therapy (Signed)
[facility] [address], [place], [place], [address] Phone: [phone]   Fax:  [phone]  Physical Therapy Treatment  Patient Details  Name: Colleen Lamb MRN: [id number] Date of Birth: 11-May-1951 Referring Provider: Cari Caraway, MD  Encounter Date: 09/26/2015      PT End of Session - 09/26/15 1043    Visit Number 5   Number of Visits 10   Date for PT Re-Evaluation 11/07/15   PT Start Time [id number]   PT Stop Time 1105   PT Time Calculation (min) 51 min   Activity Tolerance Patient tolerated treatment well   Behavior During Therapy [facility] for tasks assessed/performed      Past Medical History  Diagnosis Date  . Memory loss     History reviewed. No pertinent past surgical history.  There were no vitals filed for this visit.  Visit Diagnosis:  Bilateral thoracic back pain  Bilateral low back pain without sciatica  Posture abnormality  Difficulty walking      Subjective Assessment - 09/26/15 1019    Subjective MD signed order to treat balance.  Pt reports that she always hurts a little bit.     Patient Stated Goals reduce pain, stand and walk without limitaiton   Currently in Pain? Yes   Pain Score 5    Pain Location Back   Pain Orientation Mid;Lower;Upper   Pain Descriptors / Indicators Aching;Dull   Pain Type Acute pain   Pain Onset 1 to 4 weeks ago   Pain Frequency Constant            [facility] PT Assessment - 09/26/15 0001    Balance   Balance Assessed Yes   Standardized Balance Assessment   Standardized Balance Assessment Timed Up and Go Test   Timed Up and Go Test   TUG Normal TUG   Normal TUG (seconds) 26   TUG Comments difficulty following directions due to dementia                     [facility] Adult PT Treatment/Exercise - 09/26/15 0001    Exercises   Exercises Knee/Hip   Lumbar Exercises: Aerobic   Stationary Bike bike level 0 x 5 minutes- slow pace   Knee/Hip  Exercises: Standing   Heel Raises 20 reps   Hip Abduction Stengthening;Both;2 sets;10 reps   Shoulder Exercises: Seated   Row Both;Theraband   Theraband Level (Shoulder Row) Level 1 (Yellow)   Row Limitations 3x10   Shoulder Exercises: ROM/Strengthening   UBE (Upper Arm Bike) Level 0 x 6 minutes (3/3)                  PT Short Term Goals - 09/23/15 1011    PT SHORT TERM GOAL #1   Title be independent in initial HEP   Time 4   Period Weeks   Status On-going  needs assistance due to dementia   PT SHORT TERM GOAL #2   Title verbalize and demonstrate understanding of dos and dont's of osteoporosis   Time 4   Period Weeks   Status On-going   PT SHORT TERM GOAL #3   Title report a 25% reduction in LBP/thoracic pain with standing and walking   Time 4   Period Weeks   Status On-going           PT Long Term Goals - 09/12/15 1100    PT LONG TERM GOAL #1   Title be independent in advanced  HEP   Time 8   Period Weeks   Status New   PT LONG TERM GOAL #2   Title reduce FOTO to < or = to 45% limitation   Time 8   Period Weeks   Status New   PT LONG TERM GOAL #3   Title reduce pain to allow for walking for 15 minutes to improve community independence   Time 8   Period Weeks   Status New   PT LONG TERM GOAL #4   Title report a 50% reduction in LBP and thoracic pain with standing and walking   Time 8   Period Weeks   Status New   PT LONG TERM GOAL #5   Title sleep without interruption due to pain   Time 8   Period Weeks   Status New   Additional Long Term Goals   Additional Long Term Goals Yes   PT LONG TERM GOAL #6   Title demonstrate stability upon standing to improve safety with ambulation   Time 8   Period Weeks   Status New               Plan - 09/26/15 1039    Clinical Impression Statement Pt requires frequent verbal cues during treatment due to dementia.  TUG score was 25 seconds with need for frequent instructions during testing.  Pt  tolerated balance exercises well in the clinic today with SBA for safety.  Pt will continue to PT for balance training due to gait instability and strength training for lumbar/thoracic strength and pain modalities as needed.     Pt will benefit from skilled therapeutic intervention in order to improve on the following deficits Abnormal gait;Decreased range of motion;Pain;Postural dysfunction;Decreased strength;Decreased mobility;Decreased balance;Impaired flexibility;Improper body mechanics;Decreased activity tolerance;Decreased endurance   Rehab Potential Good   PT Frequency 2x / week   PT Duration 8 weeks   PT Treatment/Interventions ADLs/Self Care Home Management;Cryotherapy;Electrical Stimulation;Moist Heat;Therapeutic exercise;Therapeutic activities;Functional mobility training;Stair training;Ultrasound;Neuromuscular re-education;Patient/family education;Manual techniques;Taping;Dry needling;Passive range of motion   PT Next Visit Plan modalities for pain, postural strength, balance and endurance training   Consulted and Agree with Plan of Care Patient        Problem List Patient Active Problem List   Diagnosis Date Noted  . 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 Memory loss 01/17/2014    [name], PT 09/26/2015, 10:44 AM  [facility] [address] [place], [place], [address] Phone: [phone]   Fax:  [phone]  Name: Colleen Lamb MRN: [id number] Date of Birth: 1951/06/27

## 2015-09-30 ENCOUNTER — Ambulatory Visit: Payer: PPO

## 2015-09-30 DIAGNOSIS — R262 Difficulty in walking, not elsewhere classified: Secondary | ICD-10-CM

## 2015-09-30 DIAGNOSIS — R293 Abnormal posture: Secondary | ICD-10-CM

## 2015-09-30 DIAGNOSIS — M545 Low back pain, unspecified: Secondary | ICD-10-CM

## 2015-09-30 DIAGNOSIS — M546 Pain in thoracic spine: Secondary | ICD-10-CM

## 2015-09-30 NOTE — Therapy (Signed)
[facility] [address], [place], [place], [address] Phone: [phone]   Fax:  [phone]  Physical Therapy Treatment  Patient Details  Name: Colleen Lamb MRN: [id number] Date of Birth: 03-Jun-1951 Referring Provider: Cari Caraway, MD  Encounter Date: 09/30/2015      PT End of Session - 09/30/15 1004    Visit Number 6   Number of Visits 10   Date for PT Re-Evaluation 11/07/15   PT Start Time 0931   PT Stop Time 1011   PT Time Calculation (min) 40 min   Activity Tolerance Patient tolerated treatment well   Behavior During Therapy [facility] for tasks assessed/performed      Past Medical History  Diagnosis Date  . Memory loss     History reviewed. No pertinent past surgical history.  There were no vitals filed for this visit.  Visit Diagnosis:  Bilateral thoracic back pain  Bilateral low back pain without sciatica  Posture abnormality  Difficulty walking      Subjective Assessment - 09/30/15 0930    Subjective Pt's sister reports that she is moving better at home and balance seems to be improved.     Currently in Pain? Yes   Pain Score 5    Pain Location Thoracic   Pain Orientation Mid   Pain Descriptors / Indicators Aching;Dull   Pain Type Chronic pain   Pain Onset More than a month ago   Pain Frequency Constant   Aggravating Factors  sitting too long, standing and walking   Pain Relieving Factors pain medication, rest, change of position                         [facility] Adult PT Treatment/Exercise - 09/30/15 0001    Knee/Hip Exercises: Aerobic   Nustep Level 1x 8 minutes  seat 4, arms 7   Knee/Hip Exercises: Standing   Hip Abduction Stengthening;Both;2 sets;10 reps   Forward Step Up 2 sets;10 reps;Hand Hold: 2;Both   Rocker Board 3 minutes   Rocker Board Limitations bil UE support   Shoulder Exercises: Seated   Flexion Both;20 reps;Weights   Flexion Weight (lbs) 1   Shoulder Exercises: ROM/Strengthening   UBE (Upper Arm Bike) Level 0 x 6 minutes (3/3)                  PT Short Term Goals - 09/30/15 [id number]    PT SHORT TERM GOAL #1   Title be independent in initial HEP   Status On-going  requires verbal cues due to dementia   PT SHORT TERM GOAL #2   Title verbalize and demonstrate understanding of dos and dont's of osteoporosis   Time 4   Period Weeks   Status On-going   PT SHORT TERM GOAL #3   Title report a 25% reduction in LBP/thoracic pain with standing and walking   Status Achieved  50% improvement reported           PT Long Term Goals - 09/12/15 1100    PT LONG TERM GOAL #1   Title be independent in advanced HEP   Time 8   Period Weeks   Status New   PT LONG TERM GOAL #2   Title reduce FOTO to < or = to 45% limitation   Time 8   Period Weeks   Status New   PT LONG TERM GOAL #3   Title reduce pain to allow for walking for 15 minutes  to improve community independence   Time 8   Period Weeks   Status New   PT LONG TERM GOAL #4   Title report a 50% reduction in LBP and thoracic pain with standing and walking   Time 8   Period Weeks   Status New   PT LONG TERM GOAL #5   Title sleep without interruption due to pain   Time 8   Period Weeks   Status New   Additional Long Term Goals   Additional Long Term Goals Yes   PT LONG TERM GOAL #6   Title demonstrate stability upon standing to improve safety with ambulation   Time 8   Period Weeks   Status New               Plan - 09/30/15 [id number]    Clinical Impression Statement Pt reports 50% overall reduction in pain since the start of care.  Pt's sister reports that pt is moving better with improved balance.  Pt has difficulty following instructions in the clininc due to dementia.  She requires verbal cues for correct techniques with activity in the clinic.  Pt will continue to benefit from skilled PT for strength, flexibility and balance training to improve  function at home and safety with gait.     Pt will benefit from skilled therapeutic intervention in order to improve on the following deficits Abnormal gait;Decreased range of motion;Pain;Postural dysfunction;Decreased strength;Decreased mobility;Decreased balance;Impaired flexibility;Improper body mechanics;Decreased activity tolerance;Decreased endurance   Rehab Potential Good   PT Frequency 2x / week   PT Duration 8 weeks   PT Treatment/Interventions ADLs/Self Care Home Management;Cryotherapy;Electrical Stimulation;Moist Heat;Therapeutic exercise;Therapeutic activities;Functional mobility training;Stair training;Ultrasound;Neuromuscular re-education;Patient/family education;Manual techniques;Taping;Dry needling;Passive range of motion   PT Next Visit Plan modalities for pain, postural strength, balance and endurance training   Consulted and Agree with Plan of Care Patient        Problem List Patient Active Problem List   Diagnosis Date Noted  . 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 Memory loss 01/17/2014    TAKACS,KELLY, PT 09/30/2015, 10:05 AM  [facility] [address], [place], [place], [address] Phone: [phone]   Fax:  [phone]  Name: Colleen Lamb MRN: [id number] Date of Birth: [birth year]

## 2015-10-03 ENCOUNTER — Ambulatory Visit: Payer: PPO | Admitting: Physical Therapy

## 2015-10-07 ENCOUNTER — Ambulatory Visit: Payer: PPO | Attending: Family Medicine | Admitting: Physical Therapy

## 2015-10-07 ENCOUNTER — Ambulatory Visit
Admission: RE | Admit: 2015-10-07 | Discharge: 2015-10-07 | Disposition: A | Discharge status: Home / Self Care | Payer: PPO | Source: Ambulatory Visit

## 2015-10-07 ENCOUNTER — Encounter: Payer: Self-pay | Admitting: Physical Therapy

## 2015-10-07 DIAGNOSIS — Z1231 Encounter for screening mammogram for malignant neoplasm of breast: Secondary | ICD-10-CM | POA: Diagnosis not present

## 2015-10-07 DIAGNOSIS — R262 Difficulty in walking, not elsewhere classified: Secondary | ICD-10-CM

## 2015-10-07 DIAGNOSIS — M546 Pain in thoracic spine: Secondary | ICD-10-CM

## 2015-10-07 DIAGNOSIS — R293 Abnormal posture: Secondary | ICD-10-CM | POA: Diagnosis not present

## 2015-10-07 DIAGNOSIS — M545 Low back pain, unspecified: Secondary | ICD-10-CM

## 2015-10-07 NOTE — Patient Instructions (Signed)
Knee High   Holding stable object, raise knee to hip level, then lower knee. Repeat with other knee. Complete __10_ repetitions. Do __2__ sessions per day.  ABDUCTION: Standing (Active)   Stand, feet flat. Lift right leg out to side. Use _0__ lbs. Complete __10_ repetitions. Perform __2_ sessions per day.         EXTENSION: Standing (Active)  Stand, both feet flat. Draw right leg behind body as far as possible. Use 0___ lbs. Complete 10 repetitions. Perform __2_ sessions per day.  Copyright  VHI. All rights reserved   

## 2015-10-07 NOTE — Therapy (Addendum)
[facility] [address] [place], [place], [address] Phone: [phone]   Fax:  [phone]  Physical Therapy Treatment  Patient Details  Name: Colleen Lamb MRN: [id number] Date of Birth: 03/12/1951 Referring Provider: Cari Caraway, MD  Encounter Date: 10/07/2015  Visit number        7 Date of re-evaluation  11/26/15    Past Medical History  Diagnosis Date  . Memory loss     History reviewed. No pertinent past surgical history.  There were no vitals filed for this visit.  Visit Diagnosis:  Bilateral thoracic back pain  Bilateral low back pain without sciatica  Posture abnormality  Difficulty walking      Subjective Assessment - 10/07/15 0936    Subjective Pt's sister reports patient is moving better at home and does not need pain meds   Patient is accompained by: Family member   Pertinent History osteoporosis: t score -3.0, dementia with difficulty with follow-through with HEP and with following instructions in the clinic   Limitations Sitting;Standing;Walking   How long can you sit comfortably? 10 minutes   How long can you stand comfortably? 5 minutes   How long can you walk comfortably? 5 minutes   Diagnostic tests x-ray: no fracture (08/21/15)   Patient Stated Goals reduce pain, stand and walk without limitaiton   Currently in Pain? No/denies   Multiple Pain Sites No                         [facility] Adult PT Treatment/Exercise - 10/07/15 0001    Exercises   Exercises Knee/Hip   Neck Exercises: Seated   Other Seated Exercise Cervical AROM in bil rotation and flexion 2x20 seconds   Knee/Hip Exercises: Aerobic   Nustep Level 2x 10 minutes  seat #4, arms # 7   Knee/Hip Exercises: Standing   Hip Flexion Stengthening;Both;2 sets;10 reps  2# added   Hip Abduction Stengthening;Both;2 sets;10 reps  2# added   Hip Extension Stengthening;Both;2 sets;10 reps  2# added   Forward  Step Up --   Rocker Board 3 minutes   Rocker Board Limitations bil UE support   Shoulder Exercises: Seated   Flexion Both;20 reps;Weights   Flexion Weight (lbs) 1   Shoulder Exercises: ROM/Strengthening   UBE (Upper Arm Bike) Level 0 x 6 minutes (3/3)   Manual Therapy   Manual Therapy Soft tissue mobilization   Soft tissue mobilization gentle soft tissue work to thoracic and lumbar paraapinals in prone                PT Education - 10/07/15 1002    Education provided Yes   Education Details standing hip: extension, abduction and hip/knee flexion   Person(s) Educated Patient;Caregiver(s)  sister   Methods Explanation;Demonstration;Handout   Comprehension Returned demonstration          PT Short Term Goals - 10/07/15 0940    PT SHORT TERM GOAL #1   Title be independent in initial HEP   Time 4   Period Weeks   Status Achieved   PT SHORT TERM GOAL #2   Title verbalize and demonstrate understanding of dos and dont's of osteoporosis   Time 4   Period Weeks   Status On-going   PT SHORT TERM GOAL #3   Title report a 25% reduction in LBP/thoracic pain with standing and walking   Time 4   Period Weeks   Status Achieved  PT Long Term Goals - 10/07/15 0941    PT LONG TERM GOAL #1   Title be independent in advanced HEP   Time 8   Period Weeks   Status On-going   PT LONG TERM GOAL #2   Title reduce FOTO to < or = to 45% limitation   Time 8   Period Weeks   Status On-going   PT LONG TERM GOAL #3   Title reduce pain to allow for walking for 15 minutes to improve community independence   Time 8   Period Weeks   Status On-going   PT LONG TERM GOAL #4   Title report a 50% reduction in LBP and thoracic pain with standing and walking   Time 8   Period Weeks   Status On-going   PT LONG TERM GOAL #5   Title sleep without interruption due to pain   Time 8   Period Weeks   Status Achieved   PT LONG TERM GOAL #6   Title demonstrate stability upon  standing to improve safety with ambulation   Time 8   Period Weeks   Status On-going               Problem List Patient Active Problem List   Diagnosis Date Noted  . 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 Memory loss 01/17/2014    NAUMANN-HOUEGNIFIO,Vernard Gram PTA 10/07/2015, 10:12 AM  [facility] [address] [place], [place], [address] Phone: [phone]   Fax:  [phone]  Name: Colleen Lamb MRN: [id number] Date of Birth: 08-Sep-1950

## 2015-10-10 ENCOUNTER — Encounter: Payer: Self-pay | Admitting: Physical Therapy

## 2015-10-10 ENCOUNTER — Ambulatory Visit: Payer: PPO | Admitting: Physical Therapy

## 2015-10-10 DIAGNOSIS — M810 Age-related osteoporosis without current pathological fracture: Secondary | ICD-10-CM | POA: Diagnosis not present

## 2015-10-10 DIAGNOSIS — M546 Pain in thoracic spine: Secondary | ICD-10-CM

## 2015-10-10 DIAGNOSIS — M545 Low back pain, unspecified: Secondary | ICD-10-CM

## 2015-10-10 DIAGNOSIS — R262 Difficulty in walking, not elsewhere classified: Secondary | ICD-10-CM

## 2015-10-10 DIAGNOSIS — Z79899 Other long term (current) drug therapy: Secondary | ICD-10-CM | POA: Diagnosis not present

## 2015-10-10 DIAGNOSIS — R293 Abnormal posture: Secondary | ICD-10-CM

## 2015-10-10 NOTE — Therapy (Signed)
[facility] [address], [place], [place], [address] Phone: [phone]   Fax:  [phone]  Physical Therapy Treatment  Patient Details  Name: Colleen Lamb MRN: [id number] Date of Birth: 1951/03/28 Referring Provider: Cari Caraway, MD  Encounter Date: 10/10/2015      PT End of Session - 10/10/15 0946    Visit Number 8   Number of Visits 10   Date for PT Re-Evaluation 11/07/15   PT Start Time 0931   PT Stop Time 1030   PT Time Calculation (min) 59 min   Activity Tolerance Patient tolerated treatment well   Behavior During Therapy [facility] for tasks assessed/performed      Past Medical History  Diagnosis Date  . Memory loss     History reviewed. No pertinent past surgical history.  There were no vitals filed for this visit.  Visit Diagnosis:  Bilateral thoracic back pain  Bilateral low back pain without sciatica  Posture abnormality  Difficulty walking      Subjective Assessment - 10/10/15 0936    Subjective Pt's sister and patient report pain in low back on Right side.    Patient is accompained by: Family member  sister [name] is present   Pertinent History osteoporosis: t score -3.0, dementia with difficulty with follow-through with HEP and with following instructions in the clinic   How long can you sit comfortably? 10 minutes   How long can you stand comfortably? 5 minutes   How long can you walk comfortably? 5 minutes   Diagnostic tests x-ray: no fracture (08/21/15)   Patient Stated Goals reduce pain, stand and walk without limitaiton   Currently in Pain? Yes   Pain Score 5    Pain Location Back   Pain Orientation Right;Lower;Mid   Pain Descriptors / Indicators Aching;Dull   Pain Type Chronic pain   Pain Onset More than a month ago   Pain Frequency Constant   Aggravating Factors  sitting to long, standing and walking   Pain Relieving Factors pain medication, rest, change of position    Multiple Pain Sites No                         [facility] Adult PT Treatment/Exercise - 10/10/15 0001    Bed Mobility   Bed Mobility --  praticed transfers sit -SL-supine & reverse, slow pace   Exercises   Exercises Knee/Hip   Lumbar Exercises: Stretches   Single Knee to Chest Stretch 3 reps;20 seconds  each leg diagonal Knee to chest with Rt LE only, on HMP   Double Knee to Chest Stretch 3 reps;20 seconds  with gentle overpressure by PTA to incr stretch in lumbar   Lower Trunk Rotation 3 reps;20 seconds  while on HMP   Pelvic Tilt 3 reps  10 reps need tactile cues to initiate, while on HMP   Shoulder Exercises: ROM/Strengthening   UBE (Upper Arm Bike) Level 0 x 6 minutes (3/3)   Modalities   Modalities Electrical Stimulation;Moist Heat   Moist Heat Therapy   Number Minutes Moist Heat 15 Minutes   Moist Heat Location Lumbar Spine  during stretching exercises   Electrical Stimulation   Electrical Stimulation Location lumbar supine   Electrical Stimulation Action IFC   Electrical Stimulation Parameters to patient   Electrical Stimulation Goals Pain   Manual Therapy   Manual Therapy Soft tissue mobilization   Soft tissue mobilization gentle soft tissue work to thoracic and  lumbar paraapinals in prone                PT Education - 10/10/15 1009    Education provided Yes   Education Details basic back, SKC, LTR, Hamstring stretch   Person(s) Educated Patient   Methods Explanation;Demonstration;Handout   Comprehension Returned demonstration          PT Short Term Goals - 10/07/15 0940    PT SHORT TERM GOAL #1   Title be independent in initial HEP   Time 4   Period Weeks   Status Achieved   PT SHORT TERM GOAL #2   Title verbalize and demonstrate understanding of dos and dont's of osteoporosis   Time 4   Period Weeks   Status On-going   PT SHORT TERM GOAL #3   Title report a 25% reduction in LBP/thoracic pain with standing and walking   Time 4    Period Weeks   Status Achieved           PT Long Term Goals - 10/07/15 0941    PT LONG TERM GOAL #1   Title be independent in advanced HEP   Time 8   Period Weeks   Status On-going   PT LONG TERM GOAL #2   Title reduce FOTO to < or = to 45% limitation   Time 8   Period Weeks   Status On-going   PT LONG TERM GOAL #3   Title reduce pain to allow for walking for 15 minutes to improve community independence   Time 8   Period Weeks   Status On-going   PT LONG TERM GOAL #4   Title report a 50% reduction in LBP and thoracic pain with standing and walking   Time 8   Period Weeks   Status On-going   PT LONG TERM GOAL #5   Title sleep without interruption due to pain   Time 8   Period Weeks   Status Achieved   PT LONG TERM GOAL #6   Title demonstrate stability upon standing to improve safety with ambulation   Time 8   Period Weeks   Status On-going               Plan - 10/10/15 0946    Clinical Impression Statement Pt and sister reports patient has flare up since last Monday. Pt with palpaple tenderness and TP along thoracic/lumbar paraspinals and QL on Rt. Pt will continue to benefit from skilled PT to improve with flexibility and address pain.   Pt will benefit from skilled therapeutic intervention in order to improve on the following deficits Abnormal gait;Decreased range of motion;Pain;Postural dysfunction;Decreased strength;Decreased mobility;Decreased balance;Impaired flexibility;Improper body mechanics;Decreased activity tolerance;Decreased endurance   Rehab Potential Good   PT Frequency 2x / week   PT Duration 8 weeks   PT Treatment/Interventions ADLs/Self Care Home Management;Cryotherapy;Electrical Stimulation;Moist Heat;Therapeutic exercise;Therapeutic activities;Functional mobility training;Stair training;Ultrasound;Neuromuscular re-education;Patient/family education;Manual techniques;Taping;Dry needling;Passive range of motion   PT Next Visit Plan  modalities for pain, postural strength, balance and endurance training   PT Home Exercise Plan progress as needed   Consulted and Agree with Plan of Care Patient        Problem List Patient Active Problem List   Diagnosis Date Noted  . 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 Memory loss 01/17/2014    NAUMANN-HOUEGNIFIO,Ronnel Zuercher PTA 10/10/2015, 10:23 AM  [facility] [address] [place], [place], [address] Phone: [phone]   Fax:  [phone]  Name:  Lucyana Nimer MRN: [id number] Date of Birth: 10-09-1950

## 2015-10-10 NOTE — Patient Instructions (Signed)
Pelvic Tilt   Flatten back by tightening stomach muscles and buttocks. Repeat _10___ times per set. Do __2__ sets per session. Do _2-3___ sessions per day.  http://orth.exer.us/134   Copyright  VHI. All rights reserved. Knee to Chest (Flexion)   Pull knee toward chest. Feel stretch in lower back or buttock area. Breathing deeply, Hold _20___ seconds. Repeat with other knee. Repeat _3___ times each leg. Do 2-3____ sessions per day.  Perform with both knees to chest. X 3 with 20 sec hold.    http://gt2.exer.us/225   Copyright  VHI. All rights reserved.   Lower Trunk Rotation Stretch   Keeping back flat and feet together, rotate knees to left side. Hold _20___ seconds. Repeat __3__ times per set.  Do 2- 3____ sessions per day.  http://orth.exer.us/122   Copyright  VHI. All rights reserved.  Supine: Leg Stretch With Strap (Basic)   Lie on back with one knee bent, foot flat on floor. Hook strap around other foot. Straighten knee. Keep knee level with other knee. Hold _20__ seconds. Relax leg completely down to floor.  Repeat _3__ times  Each leg. Do  2- 3 sessions per day.

## 2015-10-14 ENCOUNTER — Ambulatory Visit: Payer: PPO

## 2015-10-14 DIAGNOSIS — D3142 Benign neoplasm of left ciliary body: Secondary | ICD-10-CM | POA: Diagnosis not present

## 2015-10-14 DIAGNOSIS — H43813 Vitreous degeneration, bilateral: Secondary | ICD-10-CM | POA: Diagnosis not present

## 2015-10-14 DIAGNOSIS — R262 Difficulty in walking, not elsewhere classified: Secondary | ICD-10-CM

## 2015-10-14 DIAGNOSIS — H01001 Unspecified blepharitis right upper eyelid: Secondary | ICD-10-CM | POA: Diagnosis not present

## 2015-10-14 DIAGNOSIS — M546 Pain in thoracic spine: Secondary | ICD-10-CM | POA: Diagnosis not present

## 2015-10-14 DIAGNOSIS — H524 Presbyopia: Secondary | ICD-10-CM | POA: Diagnosis not present

## 2015-10-14 DIAGNOSIS — M545 Low back pain, unspecified: Secondary | ICD-10-CM

## 2015-10-14 DIAGNOSIS — R293 Abnormal posture: Secondary | ICD-10-CM

## 2015-10-14 NOTE — Therapy (Signed)
[facility] [address], [place], [place], [address] Phone: [phone]   Fax:  [phone]  Physical Therapy Treatment  Patient Details  Name: Colleen Lamb MRN: [id number] Date of Birth: 06-07-1951 Referring Provider: Cari Caraway, MD  Encounter Date: 10/14/2015      PT End of Session - 10/14/15 1003    Visit Number 9   Number of Visits 19   Date for PT Re-Evaluation 12/04/15   Authorization Type G-codes on visit 65, KX at 59   PT Start Time 0932   PT Stop Time 1029   PT Time Calculation (min) 57 min   Activity Tolerance Patient tolerated treatment well   Behavior During Therapy [facility] for tasks assessed/performed      Past Medical History  Diagnosis Date  . Memory loss     History reviewed. No pertinent past surgical history.  There were no vitals filed for this visit.  Visit Diagnosis:  Bilateral thoracic back pain  Bilateral low back pain without sciatica  Posture abnormality  Difficulty walking      Subjective Assessment - 10/14/15 0937    Subjective Pt is here with her sister today.  Feeling better today than last week.  Weekend was better.     Currently in Pain? Yes   Pain Score 4    Pain Location Back   Pain Orientation Right;Mid;Lower   Pain Descriptors / Indicators Aching;Dull   Pain Type Chronic pain   Pain Onset More than a month ago   Pain Frequency Constant   Aggravating Factors  sitting too long, standing and walking   Pain Relieving Factors pain medication, rest, change of position            [facility] PT Assessment - 10/14/15 0001    Observation/Other Assessments   Focus on Therapeutic Outcomes (FOTO)  52% limitation                     [facility] Adult PT Treatment/Exercise - 10/14/15 0001    Lumbar Exercises: Stretches   Single Knee to Chest Stretch 3 reps;20 seconds   Double Knee to Chest Stretch 3 reps;20 seconds   Lower Trunk Rotation 3 reps;20 seconds   Knee/Hip Exercises: Aerobic   Nustep Level 2x 10 minutes  seat #4, arms # 7   Knee/Hip Exercises: Standing   Hip Abduction Stengthening;Both;2 sets;10 reps  2# added   Hip Extension Stengthening;Both;2 sets;10 reps  2# added   Rocker Board 3 minutes   Rocker Board Limitations bil UE support   Shoulder Exercises: ROM/Strengthening   UBE (Upper Arm Bike) Level 0 x 6 minutes (3/3)   Modalities   Modalities Electrical Stimulation;Moist Heat   Moist Heat Therapy   Number Minutes Moist Heat 15 Minutes   Moist Heat Location Lumbar Spine   Electrical Stimulation   Electrical Stimulation Location Lumbar spine   Electrical Stimulation Action IFC   Electrical Stimulation Parameters 15 minutes   Electrical Stimulation Goals Pain                  PT Short Term Goals - 10/07/15 0940    PT SHORT TERM GOAL #1   Title be independent in initial HEP   Time 4   Period Weeks   Status Achieved   PT SHORT TERM GOAL #2   Title verbalize and demonstrate understanding of dos and dont's of osteoporosis   Time 4   Period Weeks   Status On-going  PT SHORT TERM GOAL #3   Title report a 25% reduction in LBP/thoracic pain with standing and walking   Time 4   Period Weeks   Status Achieved           PT Long Term Goals - 2015/10/30 [id number]    PT LONG TERM GOAL #1   Title be independent in advanced HEP   Time 8   Period Weeks   Status On-going  minimal compliance due to dementia   PT LONG TERM GOAL #2   Title reduce FOTO to < or = to 45% limitation   Time 8   Period Weeks   Status On-going  52% limitation   PT LONG TERM GOAL #3   Title reduce pain to allow for walking for 15 minutes to improve community independence   Time 8   Period Weeks   Status On-going   PT LONG TERM GOAL #4   Title report a 50% reduction in LBP and thoracic pain with standing and walking   Time 8   Period Weeks   Status On-going  flare-up last week   PT LONG TERM GOAL #5   Title sleep without  interruption due to pain   Status Achieved   PT LONG TERM GOAL #6   Title demonstrate stability upon standing to improve safety with ambulation   Time 8   Period Weeks   Status On-going               Plan - October 30, 2015 0940    Clinical Impression Statement Pt and her sister report that pain is better after flare-up of pain last week.  Pt reports that she can now sleep without interruption.  Pt with continued gait instability on level surface and with change of direction.  Pt with 4/10 back pain and FOTO score is 52% improvement (improved from evaluation).  Pt will continue to benefit from skilled PT for flexibility, strength, balance training and modalities PRN.     Pt will benefit from skilled therapeutic intervention in order to improve on the following deficits Abnormal gait;Decreased range of motion;Pain;Postural dysfunction;Decreased strength;Decreased mobility;Decreased balance;Impaired flexibility;Improper body mechanics;Decreased activity tolerance;Decreased endurance   Rehab Potential Good   PT Frequency 2x / week   PT Duration 8 weeks   PT Treatment/Interventions ADLs/Self Care Home Management;Cryotherapy;Electrical Stimulation;Moist Heat;Therapeutic exercise;Therapeutic activities;Functional mobility training;Stair training;Ultrasound;Neuromuscular re-education;Patient/family education;Manual techniques;Taping;Dry needling;Passive range of motion   PT Next Visit Plan modalities for pain, postural strength, balance and endurance training          G-Codes - October 30, 2015 0939    Functional Assessment Tool Used FOTO: 52% limitation   Functional Limitation Other PT primary   Other PT Primary Current Status [id number]) At least 40 percent but less than 60 percent impaired, limited or restricted   Other PT Primary Goal Status [id number]) At least 40 percent but less than 60 percent impaired, limited or restricted      Problem List Patient Active Problem List   Diagnosis Date Noted  .  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 Memory loss 01/17/2014    Danamarie Minami, PT Oct 30, 2015, 10:07 AM  [facility] [address] [place], [place], [address] Phone: [phone]   Fax:  [phone]  Name: Colleen Lamb MRN: [id number] Date of Birth: [birth year]

## 2015-10-17 ENCOUNTER — Ambulatory Visit: Payer: PPO | Admitting: Physical Therapy

## 2015-10-17 ENCOUNTER — Encounter: Payer: Self-pay | Admitting: Physical Therapy

## 2015-10-17 DIAGNOSIS — M545 Low back pain, unspecified: Secondary | ICD-10-CM

## 2015-10-17 DIAGNOSIS — M546 Pain in thoracic spine: Secondary | ICD-10-CM | POA: Diagnosis not present

## 2015-10-17 DIAGNOSIS — R293 Abnormal posture: Secondary | ICD-10-CM

## 2015-10-17 DIAGNOSIS — R262 Difficulty in walking, not elsewhere classified: Secondary | ICD-10-CM

## 2015-10-17 NOTE — Therapy (Signed)
[facility] [address], [place], [place], [address] Phone: [phone]   Fax:  [phone]  Physical Therapy Treatment  Patient Details  Name: Colleen Lamb MRN: [id number] Date of Birth: 04-22-1951 Referring Provider: Cari Caraway, MD  Encounter Date: 10/17/2015      PT End of Session - 10/17/15 0944    Visit Number 10   Number of Visits 19   Date for PT Re-Evaluation 2015-11-19   Authorization Type G-codes on visit 55, KX at 44   PT Start Time 0932   PT Stop Time 1028   PT Time Calculation (min) 56 min   Activity Tolerance Patient tolerated treatment well   Behavior During Therapy [facility] for tasks assessed/performed      Past Medical History  Diagnosis Date  . Memory loss     History reviewed. No pertinent past surgical history.  There were no vitals filed for this visit.  Visit Diagnosis:  Bilateral thoracic back pain  Bilateral low back pain without sciatica  Posture abnormality  Difficulty walking      Subjective Assessment - 10/17/15 0940    Subjective Pt reports back feels better.    Patient is accompained by: Family member   Pertinent History osteoporosis: t score -3.0, dementia with difficulty with follow-through with HEP and with following instructions in the clinic   Limitations Sitting;Standing;Walking   How long can you sit comfortably? 10 minutes   How long can you stand comfortably? 5 minutes   How long can you walk comfortably? 5 minutes   Diagnostic tests x-ray: no fracture (08/21/15)   Patient Stated Goals reduce pain, stand and walk without limitaiton   Currently in Pain? Yes   Pain Score 3    Pain Location Back   Pain Orientation Right;Mid;Lower   Pain Descriptors / Indicators Aching;Dull   Pain Type Chronic pain   Pain Onset More than a month ago   Aggravating Factors  sitting to long, standing and walking   Pain Relieving Factors pain medications, rest, chango of  position   Multiple Pain Sites No                         [facility] Adult PT Treatment/Exercise - 10/17/15 0001    Exercises   Exercises Knee/Hip   Lumbar Exercises: Stretches   Single Knee to Chest Stretch 3 reps;20 seconds   Double Knee to Chest Stretch 3 reps;20 seconds   Lower Trunk Rotation --   Knee/Hip Exercises: Aerobic   Nustep Level 2x 10 minutes, seat #3, arms #7   Knee/Hip Exercises: Standing   Hip Abduction Stengthening;Both;2 sets;10 reps  2# added   Hip Extension Stengthening;Both;2 sets;10 reps  2# added   Rocker Board 3 minutes   Rocker Board Limitations bil UE support   Shoulder Exercises: ROM/Strengthening   UBE (Upper Arm Bike) Level 0 x 6 minutes (3/3)   Modalities   Modalities Electrical Stimulation;Moist Heat   Moist Heat Therapy   Number Minutes Moist Heat 15 Minutes   Moist Heat Location Lumbar Spine   Electrical Stimulation   Electrical Stimulation Location Lumbar spine   Electrical Stimulation Action IFC   Electrical Stimulation Parameters 9min   Electrical Stimulation Goals Pain                  PT Short Term Goals - 10/07/15 0940    PT SHORT TERM GOAL #1   Title be independent in initial HEP  Time 4   Period Weeks   Status Achieved   PT SHORT TERM GOAL #2   Title verbalize and demonstrate understanding of dos and dont's of osteoporosis   Time 4   Period Weeks   Status On-going   PT SHORT TERM GOAL #3   Title report a 25% reduction in LBP/thoracic pain with standing and walking   Time 4   Period Weeks   Status Achieved           PT Long Term Goals - 10/14/15 [id number]    PT LONG TERM GOAL #1   Title be independent in advanced HEP   Time 8   Period Weeks   Status On-going  minimal compliance due to dementia   PT LONG TERM GOAL #2   Title reduce FOTO to < or = to 45% limitation   Time 8   Period Weeks   Status On-going  52% limitation   PT LONG TERM GOAL #3   Title reduce pain to allow for walking for  15 minutes to improve community independence   Time 8   Period Weeks   Status On-going   PT LONG TERM GOAL #4   Title report a 50% reduction in LBP and thoracic pain with standing and walking   Time 8   Period Weeks   Status On-going  flare-up last week   PT LONG TERM GOAL #5   Title sleep without interruption due to pain   Status Achieved   PT LONG TERM GOAL #6   Title demonstrate stability upon standing to improve safety with ambulation   Time 8   Period Weeks   Status On-going               Plan - 10/17/15 0946    Clinical Impression Statement Pt and sister reports back is improving since flare up of pain last week. Pt will continue to benefit from skilled PT for flexibility, strength, balance training and modalities as needed.   Pt will benefit from skilled therapeutic intervention in order to improve on the following deficits Abnormal gait;Decreased range of motion;Pain;Postural dysfunction;Decreased strength;Decreased mobility;Decreased balance;Impaired flexibility;Improper body mechanics;Decreased activity tolerance;Decreased endurance   Rehab Potential Good   Clinical Impairments Affecting Rehab Potential dementia   PT Frequency 2x / week   PT Duration 8 weeks   PT Treatment/Interventions ADLs/Self Care Home Management;Cryotherapy;Electrical Stimulation;Moist Heat;Therapeutic exercise;Therapeutic activities;Functional mobility training;Stair training;Ultrasound;Neuromuscular re-education;Patient/family education;Manual techniques;Taping;Dry needling;Passive range of motion   PT Next Visit Plan modalities for pain, postural strength, balance and endurance training   PT Home Exercise Plan progress as needed   Consulted and Agree with Plan of Care Patient        Problem List Patient Active Problem List   Diagnosis Date Noted  . 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 Memory loss 01/17/2014    NAUMANN-HOUEGNIFIO,Jeb Schloemer PTA 10/17/2015, 10:19 AM  [facility] [address], [place], [place], [address] Phone: [phone]   Fax:  [phone]  Name: Colleen Lamb MRN: [id number] Date of Birth: 12-Jun-1951

## 2015-10-21 ENCOUNTER — Ambulatory Visit: Payer: PPO

## 2015-10-24 ENCOUNTER — Ambulatory Visit: Payer: PPO

## 2015-10-24 DIAGNOSIS — M545 Low back pain, unspecified: Secondary | ICD-10-CM

## 2015-10-24 DIAGNOSIS — M546 Pain in thoracic spine: Secondary | ICD-10-CM | POA: Diagnosis not present

## 2015-10-24 DIAGNOSIS — R262 Difficulty in walking, not elsewhere classified: Secondary | ICD-10-CM

## 2015-10-24 DIAGNOSIS — R293 Abnormal posture: Secondary | ICD-10-CM

## 2015-10-24 NOTE — Therapy (Addendum)
[facility] [address] [place], [place], [address] Phone: [phone]   Fax:  [phone]  Physical Therapy Treatment  Patient Details  Name: Colleen Lamb MRN: [id number] Date of Birth: [birth year] Referring Provider: Cari Caraway, MD  Encounter Date: 10/24/2015      PT End of Session - 10/24/15 0957    Visit Number 11   Number of Visits 19   Date for PT Re-Evaluation November 11, 2015   Authorization Type G-codes on visit 61, KX at 32   PT Start Time 0930   PT Stop Time 1025   PT Time Calculation (min) 55 min   Activity Tolerance Patient tolerated treatment well   Behavior During Therapy [facility] for tasks assessed/performed      Past Medical History  Diagnosis Date  . Memory loss     History reviewed. No pertinent past surgical history.  There were no vitals filed for this visit.  Visit Diagnosis:  Bilateral thoracic back pain  Bilateral low back pain without sciatica  Posture abnormality  Difficulty walking      Subjective Assessment - 10/24/15 0936    Subjective Pt reports that her back is feeling better.  Requested for today to be her last day.     Currently in Pain? Yes   Pain Score 1    Pain Location Back   Pain Orientation Right;Mid;Lower   Pain Descriptors / Indicators Aching;Dull   Pain Type Chronic pain   Pain Onset More than a month ago   Pain Frequency Constant   Aggravating Factors  sitting too long, standing and walking   Pain Relieving Factors pain medications, rest, change of position                         [facility] Adult PT Treatment/Exercise - 10/24/15 0001    Exercises   Exercises Knee/Hip   Knee/Hip Exercises: Aerobic   Nustep Level 2x 10 minutes, seat #3, arms #7   Knee/Hip Exercises: Standing   Hip Abduction Stengthening;Both;2 sets;10 reps  2# added   Hip Extension Stengthening;Both;2 sets;10 reps  2# added   Rocker Board 3 minutes   Rocker Board  Limitations bil UE support   Shoulder Exercises: Seated   Flexion Both;20 reps;Weights   Flexion Weight (lbs) 1   Shoulder Exercises: ROM/Strengthening   UBE (Upper Arm Bike) Level 0 x 6 minutes (3/3)   Modalities   Modalities Electrical Stimulation;Moist Heat   Moist Heat Therapy   Number Minutes Moist Heat 15 Minutes   Moist Heat Location Lumbar Spine   Electrical Stimulation   Electrical Stimulation Location Lumbar spine   Electrical Stimulation Action IFC   Electrical Stimulation Parameters 15 minutes   Electrical Stimulation Goals Pain                  PT Short Term Goals - 10/07/15 0940    PT SHORT TERM GOAL #1   Title be independent in initial HEP   Time 4   Period Weeks   Status Achieved   PT SHORT TERM GOAL #2   Title verbalize and demonstrate understanding of dos and dont's of osteoporosis   Time 4   Period Weeks   Status On-going   PT SHORT TERM GOAL #3   Title report a 25% reduction in LBP/thoracic pain with standing and walking   Time 4   Period Weeks   Status Achieved  PT Long Term Goals - Nov 18, 2015 7902    PT LONG TERM GOAL #1   Title be independent in advanced HEP   Time 8   Period Weeks   Status Achieved  limited compliance due to dementia   PT LONG TERM GOAL #2   Title reduce FOTO to < or = to 45% limitation   Time 8   Period Weeks   Status Partially Met  52% limitation   PT LONG TERM GOAL #3   Title reduce pain to allow for walking for 15 minutes to improve community independence   Status Achieved   PT LONG TERM GOAL #4   Title report a 50% reduction in LBP and thoracic pain with standing and walking   Status Achieved   PT LONG TERM GOAL #5   Title sleep without interruption due to pain   Status Achieved   PT LONG TERM GOAL #6   Title demonstrate stability upon standing to improve safety with ambulation   Time 8   Period Weeks   Status Partially Met               Plan - 2015-11-18 0941    Clinical  Impression Statement Pt with mild LBP today and rates it 1/10 in the thoracic and lumbar spine.  Pt denies any sleep limitations due to pain. Stability on level surface is improved this week with clinic activity.  Pt tolerates all exercise in the clinic without increased pain.  Pt will D/C to HEP for continued gains.   Pt will benefit from skilled therapeutic intervention in order to improve on the following deficits --   PT Frequency --   PT Duration --   PT Treatment/Interventions --   PT Next Visit Plan D/C PT to HEP   Consulted and Agree with Plan of Care Patient          G-Codes - 11-18-15 1037    Functional Assessment Tool Used FOTO: 52% limitation   Functional Limitation Other PT primary   Other PT Primary Goal Status (I0973) At least 40 percent but less than 60 percent impaired, limited or restricted   Other PT Primary Discharge Status ([id number]) At least 40 percent but less than 60 percent impaired, limited or restricted      Problem List Patient Active Problem List   Diagnosis Date Noted  . 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 Memory loss 01/17/2014   PHYSICAL THERAPY DISCHARGE SUMMARY  Visits from Start of Care: 11  Current functional level related to goals / functional outcomes: See above for current status.  Pt will discharge to HEP per pt request.    Remaining deficits: Intermittent LBP/thoracic pain.  Balance deficits.  Pt has HEP in place for continued gains.     Education / Equipment: HEP, Educational psychologist. Plan: Patient agrees to discharge.  Patient goals were partially met. Patient is being discharged due to being pleased with the current functional level.  ?????     TAKACS,KELLY, PT 11-18-2015, 10:46 AM  [facility] [address] [place], [place], [address] Phone: [phone]   Fax:  [phone]  Name: Colleen Lamb MRN: [id number] Date of Birth: 19-Mar-1951

## 2015-10-25 DIAGNOSIS — M810 Age-related osteoporosis without current pathological fracture: Secondary | ICD-10-CM | POA: Diagnosis not present

## 2015-11-04 ENCOUNTER — Ambulatory Visit: Payer: PPO

## 2015-12-24 DIAGNOSIS — N898 Other specified noninflammatory disorders of vagina: Secondary | ICD-10-CM | POA: Diagnosis not present

## 2015-12-24 DIAGNOSIS — Z1389 Encounter for screening for other disorder: Secondary | ICD-10-CM | POA: Diagnosis not present

## 2015-12-24 DIAGNOSIS — F0390 Unspecified dementia without behavioral disturbance: Secondary | ICD-10-CM | POA: Diagnosis not present

## 2015-12-24 DIAGNOSIS — M810 Age-related osteoporosis without current pathological fracture: Secondary | ICD-10-CM | POA: Diagnosis not present

## 2015-12-24 DIAGNOSIS — Z124 Encounter for screening for malignant neoplasm of cervix: Secondary | ICD-10-CM | POA: Diagnosis not present

## 2015-12-24 DIAGNOSIS — R21 Rash and other nonspecific skin eruption: Secondary | ICD-10-CM | POA: Diagnosis not present

## 2015-12-24 DIAGNOSIS — Z7189 Other specified counseling: Secondary | ICD-10-CM | POA: Diagnosis not present

## 2015-12-24 DIAGNOSIS — Z0000 Encounter for general adult medical examination without abnormal findings: Secondary | ICD-10-CM | POA: Diagnosis not present

## 2015-12-24 DIAGNOSIS — Z23 Encounter for immunization: Secondary | ICD-10-CM | POA: Diagnosis not present

## 2015-12-24 DIAGNOSIS — Z113 Encounter for screening for infections with a predominantly sexual mode of transmission: Secondary | ICD-10-CM | POA: Diagnosis not present

## 2016-02-18 DIAGNOSIS — E559 Vitamin D deficiency, unspecified: Secondary | ICD-10-CM | POA: Diagnosis not present

## 2016-03-16 DIAGNOSIS — Z111 Encounter for screening for respiratory tuberculosis: Secondary | ICD-10-CM | POA: Diagnosis not present

## 2016-03-23 DIAGNOSIS — E210 Primary hyperparathyroidism: Secondary | ICD-10-CM | POA: Diagnosis not present

## 2016-03-23 DIAGNOSIS — E559 Vitamin D deficiency, unspecified: Secondary | ICD-10-CM | POA: Diagnosis not present

## 2016-03-26 DIAGNOSIS — R410 Disorientation, unspecified: Secondary | ICD-10-CM | POA: Diagnosis not present

## 2016-03-26 DIAGNOSIS — R4701 Aphasia: Secondary | ICD-10-CM | POA: Diagnosis not present

## 2016-03-26 DIAGNOSIS — N3000 Acute cystitis without hematuria: Secondary | ICD-10-CM | POA: Diagnosis not present

## 2016-03-27 ENCOUNTER — Ambulatory Visit (INDEPENDENT_AMBULATORY_CARE_PROVIDER_SITE_OTHER): Payer: PPO | Admitting: Neurology

## 2016-03-27 ENCOUNTER — Encounter: Payer: Self-pay | Admitting: Neurology

## 2016-03-27 VITALS — BP 130/80 | HR 67 | Ht 60.0 in | Wt 122.0 lb

## 2016-03-27 DIAGNOSIS — H53462 Homonymous bilateral field defects, left side: Secondary | ICD-10-CM | POA: Diagnosis not present

## 2016-03-27 DIAGNOSIS — F03B0 Unspecified dementia, moderate, without behavioral disturbance, psychotic disturbance, mood disturbance, and anxiety: Secondary | ICD-10-CM | POA: Insufficient documentation

## 2016-03-27 DIAGNOSIS — F0391 Unspecified dementia with behavioral disturbance: Secondary | ICD-10-CM | POA: Diagnosis not present

## 2016-03-27 DIAGNOSIS — F03B18 Unspecified dementia, moderate, with other behavioral disturbance: Secondary | ICD-10-CM

## 2016-03-27 DIAGNOSIS — F0390 Unspecified dementia without behavioral disturbance: Secondary | ICD-10-CM | POA: Insufficient documentation

## 2016-03-27 MED ORDER — CITALOPRAM HYDROBROMIDE 10 MG PO TABS: 10.0000 mg | ORAL_TABLET | Freq: Every day | ORAL | 11 refills | Status: AC
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
## 2016-03-27 NOTE — Patient Instructions (Addendum)
1. Continue Aricept and Namenda 2. Start Citalopram 10mg  daily 3. Schedule MRI brain without contrast. We have sent a referral to [facility] for your MRI and they will call you directly to schedule your appt. They are located at [place]. If you need to contact them directly please call [phone]. 4. Recommend 24/7 supervision/nursing home/memory care facility 5. Follow-up in 6 months

## 2016-03-30 DIAGNOSIS — E559 Vitamin D deficiency, unspecified: Secondary | ICD-10-CM | POA: Diagnosis not present

## 2016-03-30 DIAGNOSIS — E210 Primary hyperparathyroidism: Secondary | ICD-10-CM | POA: Diagnosis not present

## 2016-03-30 DIAGNOSIS — M810 Age-related osteoporosis without current pathological fracture: Secondary | ICD-10-CM | POA: Diagnosis not present

## 2016-04-02 DIAGNOSIS — Z9181 History of falling: Secondary | ICD-10-CM | POA: Diagnosis not present

## 2016-04-02 DIAGNOSIS — M6281 Muscle weakness (generalized): Secondary | ICD-10-CM | POA: Diagnosis not present

## 2016-04-02 DIAGNOSIS — M810 Age-related osteoporosis without current pathological fracture: Secondary | ICD-10-CM | POA: Diagnosis not present

## 2016-04-02 DIAGNOSIS — N3000 Acute cystitis without hematuria: Secondary | ICD-10-CM | POA: Diagnosis not present

## 2016-04-02 DIAGNOSIS — R4701 Aphasia: Secondary | ICD-10-CM | POA: Diagnosis not present

## 2016-04-02 DIAGNOSIS — G8929 Other chronic pain: Secondary | ICD-10-CM | POA: Diagnosis not present

## 2016-04-02 DIAGNOSIS — F0390 Unspecified dementia without behavioral disturbance: Secondary | ICD-10-CM | POA: Diagnosis not present

## 2016-04-02 DIAGNOSIS — M545 Low back pain: Secondary | ICD-10-CM | POA: Diagnosis not present

## 2016-04-06 DIAGNOSIS — M810 Age-related osteoporosis without current pathological fracture: Secondary | ICD-10-CM | POA: Diagnosis not present

## 2016-04-06 DIAGNOSIS — F0390 Unspecified dementia without behavioral disturbance: Secondary | ICD-10-CM | POA: Diagnosis not present

## 2016-04-06 DIAGNOSIS — N3000 Acute cystitis without hematuria: Secondary | ICD-10-CM | POA: Diagnosis not present

## 2016-04-06 DIAGNOSIS — Z9181 History of falling: Secondary | ICD-10-CM | POA: Diagnosis not present

## 2016-04-06 DIAGNOSIS — M545 Low back pain: Secondary | ICD-10-CM | POA: Diagnosis not present

## 2016-04-06 DIAGNOSIS — M6281 Muscle weakness (generalized): Secondary | ICD-10-CM | POA: Diagnosis not present

## 2016-04-06 DIAGNOSIS — R4701 Aphasia: Secondary | ICD-10-CM | POA: Diagnosis not present

## 2016-04-13 DIAGNOSIS — M810 Age-related osteoporosis without current pathological fracture: Secondary | ICD-10-CM | POA: Diagnosis not present

## 2016-04-13 DIAGNOSIS — N3000 Acute cystitis without hematuria: Secondary | ICD-10-CM | POA: Diagnosis not present

## 2016-04-13 DIAGNOSIS — M6281 Muscle weakness (generalized): Secondary | ICD-10-CM | POA: Diagnosis not present

## 2016-04-13 DIAGNOSIS — F0390 Unspecified dementia without behavioral disturbance: Secondary | ICD-10-CM | POA: Diagnosis not present

## 2016-04-13 DIAGNOSIS — M545 Low back pain: Secondary | ICD-10-CM | POA: Diagnosis not present

## 2016-04-13 DIAGNOSIS — R4701 Aphasia: Secondary | ICD-10-CM | POA: Diagnosis not present

## 2016-04-13 DIAGNOSIS — Z9181 History of falling: Secondary | ICD-10-CM | POA: Diagnosis not present

## 2016-04-20 DIAGNOSIS — R4701 Aphasia: Secondary | ICD-10-CM | POA: Diagnosis not present

## 2016-04-20 DIAGNOSIS — F0390 Unspecified dementia without behavioral disturbance: Secondary | ICD-10-CM | POA: Diagnosis not present

## 2016-04-20 DIAGNOSIS — M6281 Muscle weakness (generalized): Secondary | ICD-10-CM | POA: Diagnosis not present

## 2016-04-20 DIAGNOSIS — M545 Low back pain: Secondary | ICD-10-CM | POA: Diagnosis not present

## 2016-04-20 DIAGNOSIS — M810 Age-related osteoporosis without current pathological fracture: Secondary | ICD-10-CM | POA: Diagnosis not present

## 2016-04-20 DIAGNOSIS — N3000 Acute cystitis without hematuria: Secondary | ICD-10-CM | POA: Diagnosis not present

## 2016-04-20 DIAGNOSIS — Z9181 History of falling: Secondary | ICD-10-CM | POA: Diagnosis not present

## 2016-04-27 DIAGNOSIS — Z9181 History of falling: Secondary | ICD-10-CM | POA: Diagnosis not present

## 2016-04-27 DIAGNOSIS — M810 Age-related osteoporosis without current pathological fracture: Secondary | ICD-10-CM | POA: Diagnosis not present

## 2016-04-27 DIAGNOSIS — M6281 Muscle weakness (generalized): Secondary | ICD-10-CM | POA: Diagnosis not present

## 2016-04-27 DIAGNOSIS — R4701 Aphasia: Secondary | ICD-10-CM | POA: Diagnosis not present

## 2016-04-27 DIAGNOSIS — N3000 Acute cystitis without hematuria: Secondary | ICD-10-CM | POA: Diagnosis not present

## 2016-04-27 DIAGNOSIS — F0390 Unspecified dementia without behavioral disturbance: Secondary | ICD-10-CM | POA: Diagnosis not present

## 2016-04-27 DIAGNOSIS — M545 Low back pain: Secondary | ICD-10-CM | POA: Diagnosis not present

## 2016-04-30 DIAGNOSIS — N3000 Acute cystitis without hematuria: Secondary | ICD-10-CM | POA: Diagnosis not present

## 2016-04-30 DIAGNOSIS — E785 Hyperlipidemia, unspecified: Secondary | ICD-10-CM | POA: Diagnosis not present

## 2016-04-30 DIAGNOSIS — E559 Vitamin D deficiency, unspecified: Secondary | ICD-10-CM | POA: Diagnosis not present

## 2016-04-30 DIAGNOSIS — R4701 Aphasia: Secondary | ICD-10-CM | POA: Diagnosis not present

## 2016-04-30 DIAGNOSIS — Z9181 History of falling: Secondary | ICD-10-CM | POA: Diagnosis not present

## 2016-04-30 DIAGNOSIS — R350 Frequency of micturition: Secondary | ICD-10-CM | POA: Diagnosis not present

## 2016-04-30 DIAGNOSIS — F0390 Unspecified dementia without behavioral disturbance: Secondary | ICD-10-CM | POA: Diagnosis not present

## 2016-04-30 DIAGNOSIS — Z8639 Personal history of other endocrine, nutritional and metabolic disease: Secondary | ICD-10-CM | POA: Diagnosis not present

## 2016-04-30 DIAGNOSIS — M545 Low back pain: Secondary | ICD-10-CM | POA: Diagnosis not present

## 2016-04-30 DIAGNOSIS — F329 Major depressive disorder, single episode, unspecified: Secondary | ICD-10-CM | POA: Diagnosis not present

## 2016-04-30 DIAGNOSIS — M810 Age-related osteoporosis without current pathological fracture: Secondary | ICD-10-CM | POA: Diagnosis not present

## 2016-04-30 DIAGNOSIS — M6281 Muscle weakness (generalized): Secondary | ICD-10-CM | POA: Diagnosis not present

## 2016-05-05 ENCOUNTER — Inpatient Hospital Stay: Admission: RE | Admit: 2016-05-05 | Payer: PPO | Source: Ambulatory Visit

## 2016-05-05 DIAGNOSIS — R41841 Cognitive communication deficit: Secondary | ICD-10-CM | POA: Diagnosis not present

## 2016-05-05 DIAGNOSIS — M6281 Muscle weakness (generalized): Secondary | ICD-10-CM | POA: Diagnosis not present

## 2016-05-05 DIAGNOSIS — G8929 Other chronic pain: Secondary | ICD-10-CM | POA: Diagnosis not present

## 2016-05-05 DIAGNOSIS — M816 Localized osteoporosis [Lequesne]: Secondary | ICD-10-CM | POA: Diagnosis not present

## 2016-05-05 DIAGNOSIS — F0390 Unspecified dementia without behavioral disturbance: Secondary | ICD-10-CM | POA: Diagnosis not present

## 2016-05-05 DIAGNOSIS — F0280 Dementia in other diseases classified elsewhere without behavioral disturbance: Secondary | ICD-10-CM | POA: Diagnosis not present

## 2016-05-05 DIAGNOSIS — M545 Low back pain: Secondary | ICD-10-CM | POA: Diagnosis not present

## 2016-05-06 DIAGNOSIS — F329 Major depressive disorder, single episode, unspecified: Secondary | ICD-10-CM | POA: Diagnosis not present

## 2016-05-06 DIAGNOSIS — M810 Age-related osteoporosis without current pathological fracture: Secondary | ICD-10-CM | POA: Diagnosis not present

## 2016-06-01 DIAGNOSIS — M6281 Muscle weakness (generalized): Secondary | ICD-10-CM | POA: Diagnosis not present

## 2016-06-01 DIAGNOSIS — G8929 Other chronic pain: Secondary | ICD-10-CM | POA: Diagnosis not present

## 2016-06-01 DIAGNOSIS — M816 Localized osteoporosis [Lequesne]: Secondary | ICD-10-CM | POA: Diagnosis not present

## 2016-06-01 DIAGNOSIS — M545 Low back pain: Secondary | ICD-10-CM | POA: Diagnosis not present

## 2016-06-01 DIAGNOSIS — R41841 Cognitive communication deficit: Secondary | ICD-10-CM | POA: Diagnosis not present

## 2016-06-01 DIAGNOSIS — F0280 Dementia in other diseases classified elsewhere without behavioral disturbance: Secondary | ICD-10-CM | POA: Diagnosis not present

## 2016-06-01 DIAGNOSIS — F0390 Unspecified dementia without behavioral disturbance: Secondary | ICD-10-CM | POA: Diagnosis not present

## 2016-06-03 DIAGNOSIS — F329 Major depressive disorder, single episode, unspecified: Secondary | ICD-10-CM | POA: Diagnosis not present

## 2016-06-03 DIAGNOSIS — M810 Age-related osteoporosis without current pathological fracture: Secondary | ICD-10-CM | POA: Diagnosis not present

## 2016-06-03 DIAGNOSIS — F0390 Unspecified dementia without behavioral disturbance: Secondary | ICD-10-CM | POA: Diagnosis not present

## 2016-06-03 DIAGNOSIS — E213 Hyperparathyroidism, unspecified: Secondary | ICD-10-CM | POA: Diagnosis not present

## 2016-07-01 DIAGNOSIS — F329 Major depressive disorder, single episode, unspecified: Secondary | ICD-10-CM | POA: Diagnosis not present

## 2016-07-01 DIAGNOSIS — M810 Age-related osteoporosis without current pathological fracture: Secondary | ICD-10-CM | POA: Diagnosis not present

## 2016-07-01 DIAGNOSIS — F0390 Unspecified dementia without behavioral disturbance: Secondary | ICD-10-CM | POA: Diagnosis not present

## 2016-07-09 DIAGNOSIS — R6889 Other general symptoms and signs: Secondary | ICD-10-CM | POA: Diagnosis not present

## 2016-07-22 ENCOUNTER — Ambulatory Visit
Admission: RE | Admit: 2016-07-22 | Discharge: 2016-07-22 | Disposition: A | Discharge status: Home / Self Care | Payer: PPO | Source: Ambulatory Visit | Attending: Neurology | Admitting: Neurology

## 2016-07-22 DIAGNOSIS — F03B18 Unspecified dementia, moderate, with other behavioral disturbance: Secondary | ICD-10-CM

## 2016-07-22 DIAGNOSIS — H53462 Homonymous bilateral field defects, left side: Secondary | ICD-10-CM | POA: Diagnosis not present

## 2016-07-22 DIAGNOSIS — F0391 Unspecified dementia with behavioral disturbance: Secondary | ICD-10-CM

## 2016-07-30 ENCOUNTER — Telehealth: Payer: Self-pay

## 2016-07-30 DIAGNOSIS — M545 Low back pain: Secondary | ICD-10-CM | POA: Diagnosis not present

## 2016-07-30 DIAGNOSIS — F0390 Unspecified dementia without behavioral disturbance: Secondary | ICD-10-CM | POA: Diagnosis not present

## 2016-07-30 DIAGNOSIS — G8929 Other chronic pain: Secondary | ICD-10-CM | POA: Diagnosis not present

## 2016-07-30 DIAGNOSIS — R41841 Cognitive communication deficit: Secondary | ICD-10-CM | POA: Diagnosis not present

## 2016-07-30 NOTE — Telephone Encounter (Signed)
-----   Message from Cameron Sprang, MD sent at 07/29/2016  4:11 PM EST ----- Pls let her sister know the MRI brain did not show any evidence of stroke or tumor. Her symptoms are all related to the underlying dementia at this point. Thanks

## 2016-07-30 NOTE — Telephone Encounter (Signed)
Patient's sister [name] notified.

## 2016-08-04 DIAGNOSIS — G8929 Other chronic pain: Secondary | ICD-10-CM | POA: Diagnosis not present

## 2016-08-04 DIAGNOSIS — M545 Low back pain: Secondary | ICD-10-CM | POA: Diagnosis not present

## 2016-08-04 DIAGNOSIS — R41841 Cognitive communication deficit: Secondary | ICD-10-CM | POA: Diagnosis not present

## 2016-08-04 DIAGNOSIS — F0390 Unspecified dementia without behavioral disturbance: Secondary | ICD-10-CM | POA: Diagnosis not present

## 2016-09-01 DIAGNOSIS — G8929 Other chronic pain: Secondary | ICD-10-CM | POA: Diagnosis not present

## 2016-09-01 DIAGNOSIS — F0390 Unspecified dementia without behavioral disturbance: Secondary | ICD-10-CM | POA: Diagnosis not present

## 2016-09-01 DIAGNOSIS — R41841 Cognitive communication deficit: Secondary | ICD-10-CM | POA: Diagnosis not present

## 2016-09-01 DIAGNOSIS — M545 Low back pain: Secondary | ICD-10-CM | POA: Diagnosis not present

## 2016-09-02 DIAGNOSIS — F0390 Unspecified dementia without behavioral disturbance: Secondary | ICD-10-CM | POA: Diagnosis not present

## 2016-09-02 DIAGNOSIS — M810 Age-related osteoporosis without current pathological fracture: Secondary | ICD-10-CM | POA: Diagnosis not present

## 2016-09-29 ENCOUNTER — Encounter: Payer: Self-pay | Admitting: Neurology

## 2016-09-29 ENCOUNTER — Ambulatory Visit (INDEPENDENT_AMBULATORY_CARE_PROVIDER_SITE_OTHER): Payer: PPO | Admitting: Neurology

## 2016-09-29 VITALS — BP 126/82 | HR 68 | Ht 60.0 in | Wt 146.2 lb

## 2016-09-29 DIAGNOSIS — F03B18 Unspecified dementia, moderate, with other behavioral disturbance: Secondary | ICD-10-CM

## 2016-09-29 DIAGNOSIS — F0391 Unspecified dementia with behavioral disturbance: Secondary | ICD-10-CM | POA: Diagnosis not present

## 2016-09-29 NOTE — Patient Instructions (Signed)
Continue all your medications. Follow-up in 1 year, call for any changes.
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
## 2016-11-26 ENCOUNTER — Other Ambulatory Visit (HOSPITAL_COMMUNITY): Payer: Self-pay | Admitting: *Deleted

## 2016-11-27 ENCOUNTER — Ambulatory Visit (HOSPITAL_COMMUNITY)
Admission: RE | Admit: 2016-11-27 | Discharge: 2016-11-27 | Disposition: A | Discharge status: Home / Self Care | Payer: Medicare Other | Source: Ambulatory Visit | Attending: Endocrinology | Admitting: Endocrinology

## 2016-11-27 DIAGNOSIS — M810 Age-related osteoporosis without current pathological fracture: Secondary | ICD-10-CM | POA: Insufficient documentation

## 2016-11-27 MED ORDER — ZOLEDRONIC ACID 5 MG/100ML IV SOLN
5.0000 mg | Freq: Once | INTRAVENOUS | Status: AC
  Administered 2016-11-27: 5 mg via INTRAVENOUS

## 2016-11-27 MED ORDER — ZOLEDRONIC ACID 5 MG/100ML IV SOLN
INTRAVENOUS | Status: AC
  Administered 2016-11-27: 5 mg via INTRAVENOUS
  Filled 2016-11-27: qty 100

## 2017-03-31 ENCOUNTER — Other Ambulatory Visit: Payer: Self-pay | Admitting: Endocrinology

## 2017-03-31 DIAGNOSIS — Z1231 Encounter for screening mammogram for malignant neoplasm of breast: Secondary | ICD-10-CM

## 2017-03-31 DIAGNOSIS — M810 Age-related osteoporosis without current pathological fracture: Secondary | ICD-10-CM

## 2017-04-08 ENCOUNTER — Ambulatory Visit (INDEPENDENT_AMBULATORY_CARE_PROVIDER_SITE_OTHER): Payer: Medicare Other | Admitting: Neurology

## 2017-04-08 ENCOUNTER — Encounter: Payer: Self-pay | Admitting: Neurology

## 2017-04-08 VITALS — BP 124/82 | HR 70 | Ht 60.0 in | Wt 149.0 lb

## 2017-04-08 DIAGNOSIS — F0391 Unspecified dementia with behavioral disturbance: Secondary | ICD-10-CM

## 2017-04-08 DIAGNOSIS — F03B18 Unspecified dementia, moderate, with other behavioral disturbance: Secondary | ICD-10-CM

## 2017-04-08 DIAGNOSIS — G319 Degenerative disease of nervous system, unspecified: Secondary | ICD-10-CM

## 2017-04-08 MED ORDER — DIVALPROEX SODIUM ER 250 MG PO TB24: ORAL_TABLET | ORAL | 6 refills | Status: DC
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
## 2017-04-08 NOTE — Patient Instructions (Signed)
1. Start Depakote ER 250mg : Take 1 tablet every night for 2 weeks, then increase to 1 tablet twice a day 2. Continue all your other medications 3. Follow-up in 4 months, call for any changes

## 2017-04-26 ENCOUNTER — Ambulatory Visit
Admission: RE | Admit: 2017-04-26 | Discharge: 2017-04-26 | Disposition: A | Discharge status: Home / Self Care | Payer: Medicare Other | Source: Ambulatory Visit | Attending: Endocrinology | Admitting: Endocrinology

## 2017-04-26 DIAGNOSIS — M810 Age-related osteoporosis without current pathological fracture: Secondary | ICD-10-CM

## 2017-04-26 DIAGNOSIS — Z1231 Encounter for screening mammogram for malignant neoplasm of breast: Secondary | ICD-10-CM

## 2017-05-01 IMAGING — CR DG LUMBAR SPINE 2-3V
3 series · 3 of 3 positions shown · non-contrast
Comparison: 08/21/2015

CLINICAL DATA: Chronic upper and low back pain for several weeks.

EXAM:
LUMBAR SPINE - 2-3 VIEW

[t l-spine a.p.]
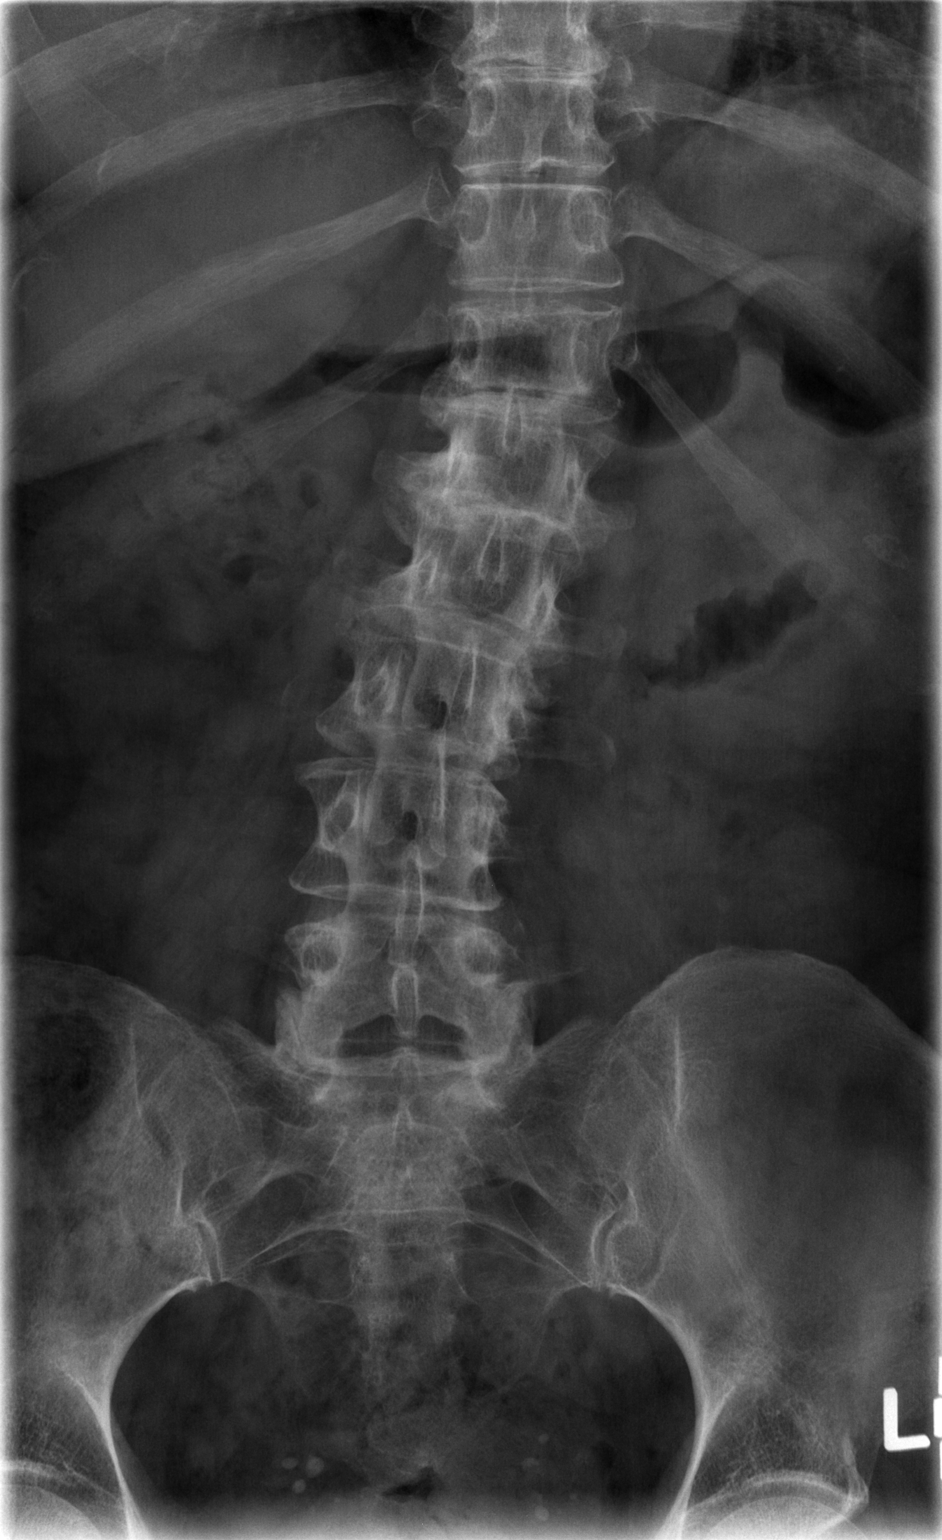

[t l-spine lat]
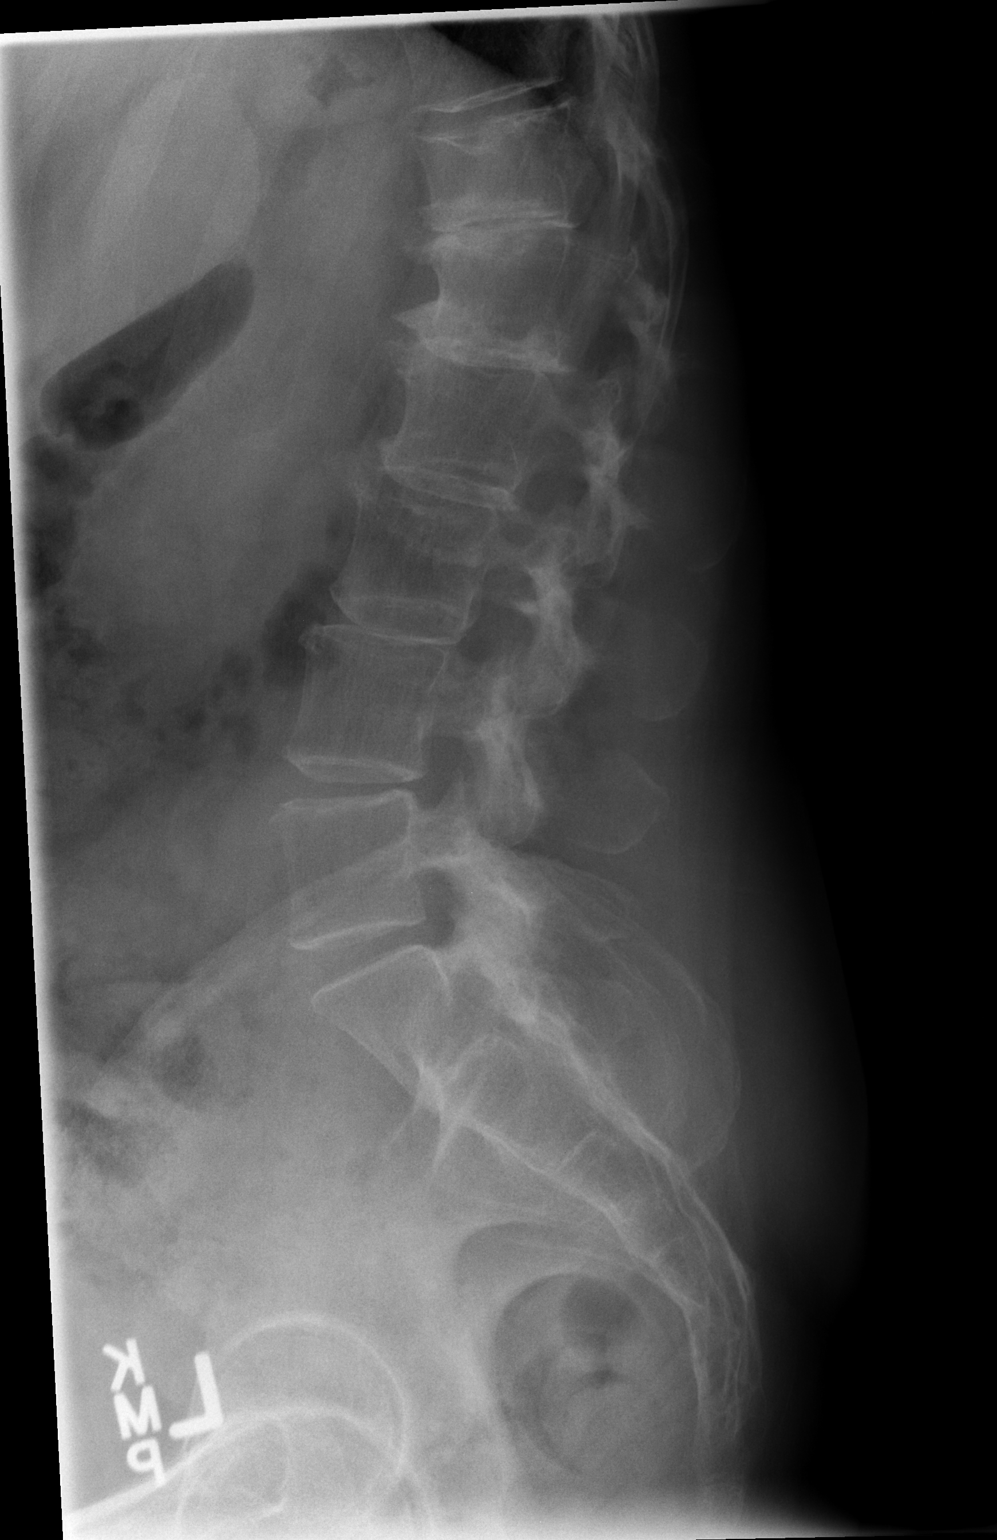

[t l-spine l5-s1 spot]
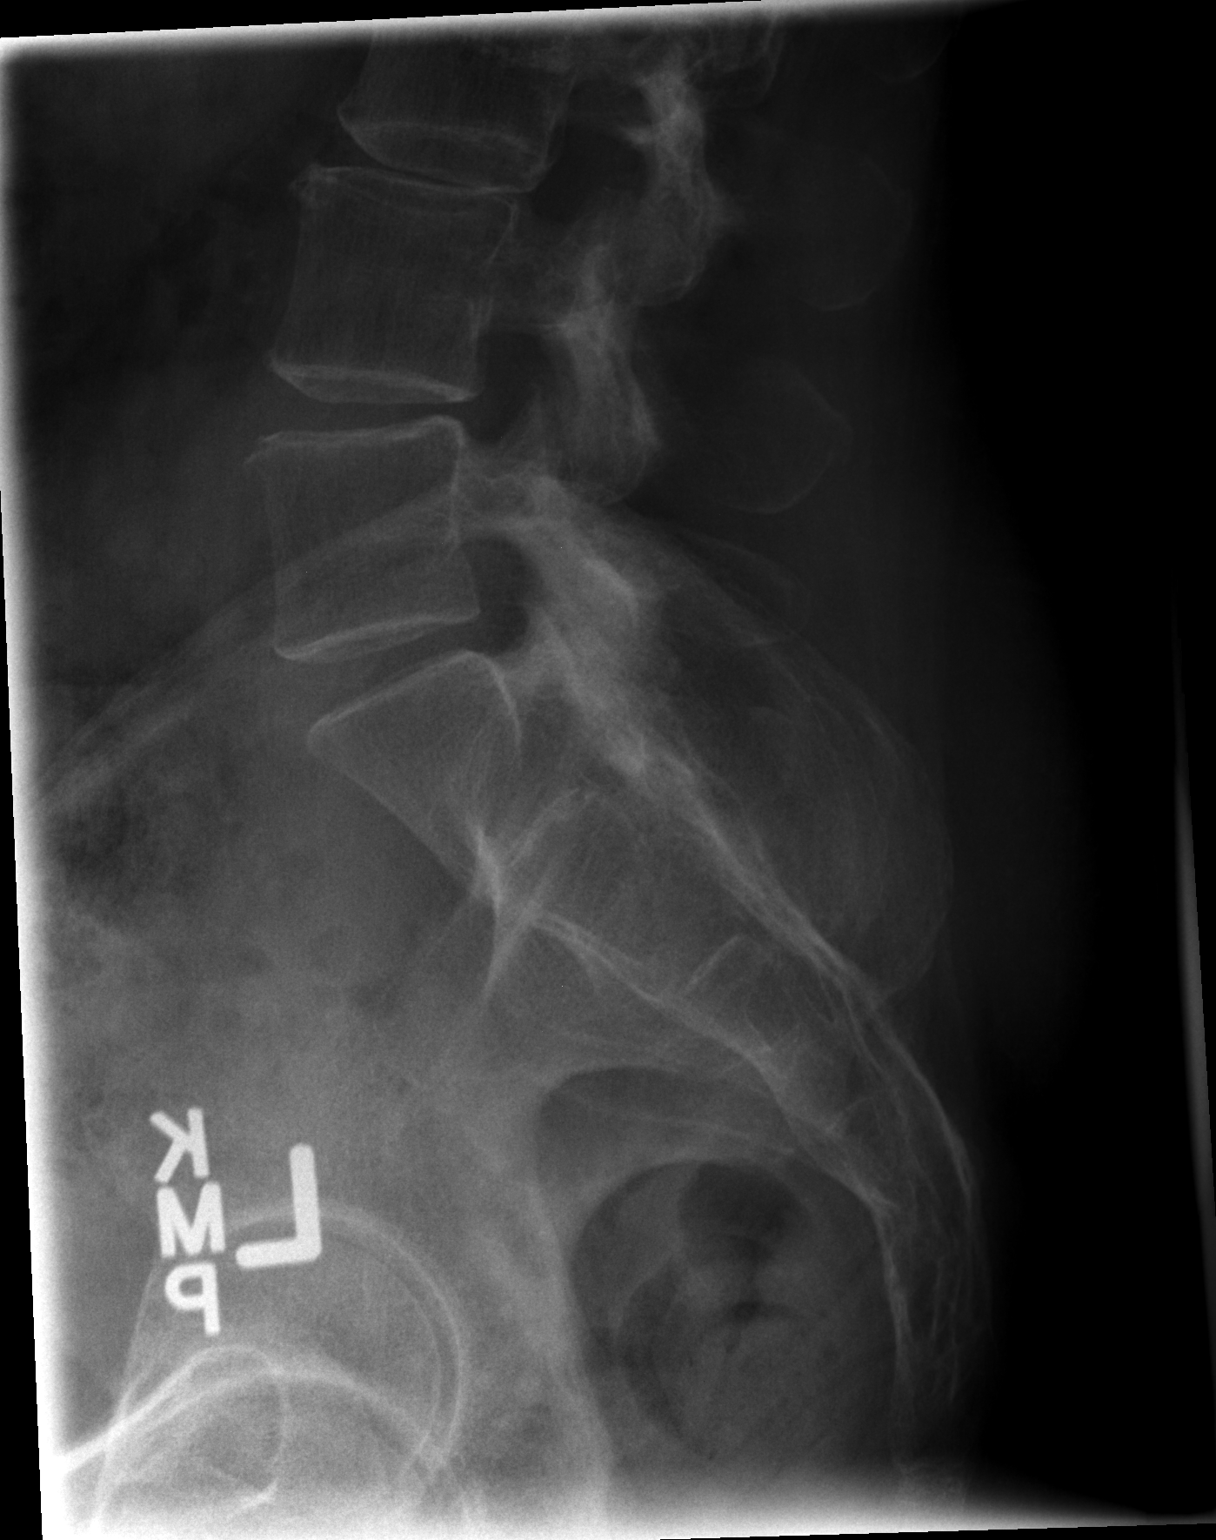

[3 of 3 positions shown; findings below may reference images not displayed]

FINDINGS: Mild scoliosis of the lumbar spine, convex right centered at L3-4.
Moderately severe Degenerative disc disease and spondylosis present
at T12-L1, L1-2, and L2-3. Preserved vertebral body heights. No
acute compression fracture. No pars defects. Normal SI joints for
age. Normal bowel gas pattern. Benign pelvic calcifications.
IMPRESSION: thoracolumbar junction moderately severe degenerative disc disease
and associated lumbar dextroscoliosis.

No acute compression fracture

## 2017-07-03 ENCOUNTER — Emergency Department (HOSPITAL_COMMUNITY)
Admission: EM | Admit: 2017-07-03 | Discharge: 2017-07-03 | Disposition: A | Discharge status: Home / Self Care | Payer: Medicare Other | Attending: Emergency Medicine | Admitting: Emergency Medicine

## 2017-07-03 ENCOUNTER — Encounter (HOSPITAL_COMMUNITY): Payer: Self-pay

## 2017-07-03 ENCOUNTER — Emergency Department (HOSPITAL_COMMUNITY): Payer: Medicare Other

## 2017-07-03 DIAGNOSIS — R55 Syncope and collapse: Secondary | ICD-10-CM | POA: Insufficient documentation

## 2017-07-03 DIAGNOSIS — Z79899 Other long term (current) drug therapy: Secondary | ICD-10-CM | POA: Insufficient documentation

## 2017-07-03 DIAGNOSIS — F0390 Unspecified dementia without behavioral disturbance: Secondary | ICD-10-CM | POA: Diagnosis not present

## 2017-07-03 LAB — I-STAT CG4 LACTIC ACID, ED
Lactic Acid, Venous: 2.74 mmol/L (ref 0.5–1.9)
Lactic Acid, Venous: 2.51 mmol/L (ref 0.5–1.9)

## 2017-07-03 LAB — URINALYSIS, ROUTINE W REFLEX MICROSCOPIC
Bilirubin Urine: NEGATIVE
GLUCOSE, UA: NEGATIVE mg/dL
HGB URINE DIPSTICK: NEGATIVE
KETONES UR: NEGATIVE mg/dL
Leukocytes, UA: NEGATIVE
Nitrite: NEGATIVE
PH: 6 (ref 5.0–8.0)
PROTEIN: NEGATIVE mg/dL
Specific Gravity, Urine: 1.023 (ref 1.005–1.030)

## 2017-07-03 LAB — CBC WITH DIFFERENTIAL/PLATELET
BASOS PCT: 0 %
Basophils Absolute: 0 10*3/uL (ref 0.0–0.1)
Eosinophils Absolute: 0.1 10*3/uL (ref 0.0–0.7)
Eosinophils Relative: 2 %
HEMATOCRIT: 42.1 % (ref 36.0–46.0)
HEMOGLOBIN: 13.3 g/dL (ref 12.0–15.0)
Lymphocytes Relative: 28 %
Lymphs Abs: 1.8 10*3/uL (ref 0.7–4.0)
MCH: 32 pg (ref 26.0–34.0)
MCHC: 31.6 g/dL (ref 30.0–36.0)
MCV: 101.4 fL — ABNORMAL HIGH (ref 78.0–100.0)
MONOS PCT: 9 %
Monocytes Absolute: 0.5 10*3/uL (ref 0.1–1.0)
NEUTROS ABS: 3.9 10*3/uL (ref 1.7–7.7)
NEUTROS PCT: 61 %
Platelets: 131 10*3/uL — ABNORMAL LOW (ref 150–400)
RBC: 4.15 MIL/uL (ref 3.87–5.11)
RDW: 14.7 % (ref 11.5–15.5)
WBC: 6.4 10*3/uL (ref 4.0–10.5)

## 2017-07-03 LAB — COMPREHENSIVE METABOLIC PANEL
ALBUMIN: 3.4 g/dL — AB (ref 3.5–5.0)
ALK PHOS: 65 U/L (ref 38–126)
ALT: 20 U/L (ref 14–54)
ANION GAP: 6 (ref 5–15)
AST: 25 U/L (ref 15–41)
BILIRUBIN TOTAL: 0.7 mg/dL (ref 0.3–1.2)
BUN: 16 mg/dL (ref 6–20)
CALCIUM: 9.7 mg/dL (ref 8.9–10.3)
CO2: 28 mmol/L (ref 22–32)
CREATININE: 1.28 mg/dL — AB (ref 0.44–1.00)
Chloride: 106 mmol/L (ref 101–111)
GFR calc Af Amer: 49 mL/min — ABNORMAL LOW (ref >60)
GFR calc non Af Amer: 42 mL/min — ABNORMAL LOW (ref >60)
GLUCOSE: 145 mg/dL — AB (ref 65–99)
Potassium: 3.9 mmol/L (ref 3.5–5.1)
Sodium: 140 mmol/L (ref 135–145)
TOTAL PROTEIN: 6.2 g/dL — AB (ref 6.5–8.1)

## 2017-07-03 LAB — CBG MONITORING, ED
Glucose-Capillary: 132 mg/dL — ABNORMAL HIGH (ref 65–99)
Glucose-Capillary: 167 mg/dL — ABNORMAL HIGH (ref 65–99)

## 2017-07-03 LAB — I-STAT TROPONIN, ED: Troponin i, poc: 0 ng/mL (ref 0.00–0.08)

## 2017-07-03 LAB — VALPROIC ACID LEVEL: VALPROIC ACID LVL: 51 ug/mL (ref 50.0–100.0)

## 2017-07-03 MED ORDER — SODIUM CHLORIDE 0.9 % IV BOLUS (SEPSIS): 1000.0000 mL | Freq: Once | INTRAVENOUS | Status: DC

## 2017-07-03 MED ORDER — SODIUM CHLORIDE 0.9 % IV BOLUS (SEPSIS)
1000.0000 mL | Freq: Once | INTRAVENOUS | Status: AC
  Administered 2017-07-03: 1000 mL via INTRAVENOUS

## 2017-07-03 NOTE — ED Triage Notes (Addendum)
Pt arrived from [facility]. Per family that was with pt, when walking down the hall today pt reported she did not feel well. They sat down and pt had syncopal episode. NO fall, no trauma. EMS reports pt was pale and diaphoretic upon arrival with CBG reading as HIGH, BP initially was 89/39, HR 50-60s. No history of DM. Pt does have hx of dementia.

## 2017-07-03 NOTE — ED Notes (Signed)
XR at bedside

## 2017-07-03 NOTE — ED Notes (Signed)
One CBG obtained from drawn blood, one from finger stick r/t EMS report of HIGH

## 2017-07-03 NOTE — ED Notes (Signed)
Patient transported to CT 

## 2017-07-03 NOTE — ED Provider Notes (Signed)
[facility] Provider Note   CSN: [id number] Arrival date & time: 07/03/17  1521     History   Chief Complaint Chief Complaint  Patient presents with  . Loss of Consciousness    HPI Colleen Lamb is a 66 y.o. female.  HPI  5 caveat secondary to dementia 66 year old female from a nursing home history of dementia presents today with reports of syncopal episode.  Family member reports that she was with her and the patient was in her usual state of health.  She was sititing in a chair.  she began having some shaking of her hands and the family never went to get help.  When they returned  Past Medical History:  Diagnosis Date  . Memory loss     Patient Active Problem List   Diagnosis Date Noted  . Moderate dementia 03/27/2016  . Left homonymous hemianopsia 03/27/2016  . 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 Memory loss 01/17/2014    History reviewed. No pertinent surgical history.  OB History    No data available       Home Medications    Prior to Admission medications   Medication Sig Start Date End Date Taking? Authorizing Provider  atorvastatin (LIPITOR) 10 MG tablet Take 10 mg by mouth daily.    [provider]  Cholecalciferol (VITAMIN D) 2000 UNITS tablet Take 4,000 Units by mouth daily.     [provider]  citalopram (CELEXA) 10 MG tablet Take 1 tablet (10 mg total) by mouth daily. 03/27/16   Cameron Sprang, MD  divalproex (DEPAKOTE ER) 250 MG 24 hr tablet Take 1 tablet at night for 2 weeks, then increase to 1 tablet twice a day 04/08/17   Cameron Sprang, MD  donepezil (ARICEPT) 10 MG tablet Take 10 mg by mouth daily. 12/19/13   [provider]  Memantine HCl ER (NAMENDA XR TITRATION PACK) 7 & 14 & 21 &28 MG CP24 Use As Directed 10/23/14   Cameron Sprang, MD  QUEtiapine (SEROQUEL) 100 MG tablet Take 100 mg by mouth at bedtime.    [provider]  QUEtiapine (SEROQUEL) 25 MG tablet Take 25 mg by  mouth 2 (two) times daily.    [provider]  ranitidine (ZANTAC) 150 MG tablet Take 150 mg by mouth 2 (two) times daily.    [provider]    Family History Family History  Problem Relation Age of Onset  . Cancer Mother   . Heart disease Father     Social History Social History  Substance Use Topics  . Smoking status: Never Smoker  . Smokeless tobacco: Never Used  . Alcohol use Yes     Allergies   Patient has no known allergies.   Review of Systems Review of Systems   Physical Exam Updated Vital Signs BP (!) 102/59 (BP Location: Right Arm)   Pulse 69   Temp 97.8 F (36.6 C) (Oral)   Resp (!) 24   Ht 1.524 m (5')   Wt 67.6 kg (149 lb)   SpO2 94%   BMI 29.10 kg/m   Physical Exam  Constitutional: She is oriented to person, place, and time. She appears well-developed and well-nourished.  HENT:  Head: Normocephalic.  Right Ear: External ear normal.  Left Ear: External ear normal.  Mouth/Throat: Oropharynx is clear and moist.  Eyes: Pupils are equal, round, and reactive to light. EOM are normal.  Neck: Normal range of motion. Neck supple.  Cardiovascular: Normal  rate, regular rhythm and normal heart sounds.   Pulmonary/Chest: Effort normal and breath sounds normal.  Abdominal: Soft. Bowel sounds are normal.  Musculoskeletal: Normal range of motion.  Neurological: She is alert and oriented to person, place, and time.  Skin: Skin is warm. Capillary refill takes less than 2 seconds.  Psychiatric: She has a normal mood and affect.  Nursing note and vitals reviewed.    ED Treatments / Results  Labs (all labs ordered are listed, but only abnormal results are displayed) Labs Reviewed - No data to display  EKG  EKG Interpretation  Date/Time:  Saturday July 03 2017 15:37:27 EDT Ventricular Rate:  71 PR Interval:    QRS Duration: 65 QT Interval:  644 QTC Calculation: 701 R Axis:   36 Text Interpretation:  Sinus rhythm Low voltage,  precordial leads Nonspecific T abnrm, anterolateral leads Confirmed by Pattricia Boss [phone]) on 07/03/2017 5:17:30 PM       Radiology Dg Chest Port 1 View  Result Date: 07/03/2017 CLINICAL DATA:  66 year old female status post syncopal episode. EXAM: PORTABLE CHEST 1 VIEW COMPARISON:  None. FINDINGS: The heart size and mediastinal contours are within normal limits. There markedly low lung volumes. No focal opacity. Question small left pleural effusion. No pneumothorax. The visualized skeletal structures are unremarkable. IMPRESSION: Low lung volumes without definite acute cardiopulmonary pathology. Question small left pleural effusion. Electronically Signed   By: Kristopher Oppenheim M.D.   On: 07/03/2017 16:37    Procedures Procedures (including critical care time)  Medications Ordered in ED Medications - No data to display   Initial Impression / Assessment and Plan / ED Course  I have reviewed the triage vital signs and the nursing notes.  Pertinent labs & imaging results that were available during my care of the patient were reviewed by me and considered in my medical decision making (see chart for details).      67 y.o. Female with episode of unresponsiveness while sitting in chair .  No co of chest pain, lightheadedness or dyspnea.  She has been otherwise well.  She is in a facility due to dementia. Work up here reveals no definite abnormality.  Initial lactic acid slightly elevated but coming down - no source of infection noted on exam, x-[name] or urine.  Patient appears stable for d/c to facility. D.W. Patient and sister.  Final Clinical Impressions(s) / ED Diagnoses   Final diagnoses:  Syncope, unspecified syncope type    New Prescriptions New Prescriptions   No medications on file     Pattricia Boss, MD 07/03/17 1826

## 2017-07-03 NOTE — ED Notes (Signed)
ED Provider at bedside. 

## 2017-07-03 NOTE — ED Notes (Signed)
Pt family member requesting to drive pt home. [facility] to confirm that they will meet family member at door with wheelchair and assistance if needed. Pt ambulatory at baseline.

## 2017-07-04 LAB — URINE CULTURE
CULTURE: NO GROWTH
Special Requests: NORMAL

## 2017-08-16 ENCOUNTER — Encounter: Payer: Self-pay | Admitting: Neurology

## 2017-08-16 ENCOUNTER — Ambulatory Visit (INDEPENDENT_AMBULATORY_CARE_PROVIDER_SITE_OTHER): Payer: Medicare Other | Admitting: Neurology

## 2017-08-16 VITALS — BP 98/58 | HR 81 | Resp 12 | Ht 59.0 in | Wt 141.0 lb

## 2017-08-16 DIAGNOSIS — F0391 Unspecified dementia with behavioral disturbance: Secondary | ICD-10-CM | POA: Diagnosis not present

## 2017-08-16 DIAGNOSIS — F03B18 Unspecified dementia, moderate, with other behavioral disturbance: Secondary | ICD-10-CM

## 2017-08-16 DIAGNOSIS — G319 Degenerative disease of nervous system, unspecified: Secondary | ICD-10-CM | POA: Diagnosis not present
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
## 2017-08-16 NOTE — Patient Instructions (Signed)
1. Continue current medications 2. Ms. Hemmer has a type of dementia affecting her visual processing. She will need help with feeding during meals as she cannot see her plate and food on the plate well. 3. Follow-up in 6 months, call for any changes  FALL PRECAUTIONS: Be cautious when walking. Scan the area for obstacles that may increase the risk of trips and falls. When getting up in the mornings, sit up at the edge of the bed for a few minutes before getting out of bed. Consider elevating the bed at the head end to avoid drop of blood pressure when getting up. Walk always in a well-lit room (use night lights in the walls). Avoid area rugs or power cords from appliances in the middle of the walkways. Use a walker or a cane if necessary and consider physical therapy for balance exercise. Get your eyesight checked regularly.  FINANCIAL OVERSIGHT: Supervision, especially oversight when making financial decisions or transactions is also recommended.  HOME SAFETY: Consider the safety of the kitchen when operating appliances like stoves, microwave oven, and blender. Consider having supervision and share cooking responsibilities until no longer able to participate in those. Accidents with firearms and other hazards in the house should be identified and addressed as well.  DRIVING: Regarding driving, in patients with progressive memory problems, driving will be impaired. We advise to have someone else do the driving if trouble finding directions or if minor accidents are reported. Independent driving assessment is available to determine safety of driving.  ABILITY TO BE LEFT ALONE: If patient is unable to contact 911 operator, consider using LifeLine, or when the need is there, arrange for someone to stay with patients. Smoking is a fire hazard, consider supervision or cessation. Risk of wandering should be assessed by caregiver and if detected at any point, supervision and safe proof recommendations should  be instituted.  MEDICATION SUPERVISION: Inability to self-administer medication needs to be constantly addressed. Implement a mechanism to ensure safe administration of the medications.  RECOMMENDATIONS FOR ALL PATIENTS WITH MEMORY PROBLEMS: 1. Continue to exercise (Recommend 30 minutes of walking everyday, or 3 hours every week) 2. Increase social interactions - continue going to [place] and enjoy social gatherings with friends and family 3. Eat healthy, avoid fried foods and eat more fruits and vegetables 4. Maintain adequate blood pressure, blood sugar, and blood cholesterol level. Reducing the risk of stroke and cardiovascular disease also helps promoting better memory. 5. Avoid stressful situations. Live a simple life and avoid aggravations. Organize your time and prepare for the next day in anticipation. 6. Sleep well, avoid any interruptions of sleep and avoid any distractions in the bedroom that may interfere with adequate sleep quality 7. Avoid sugar, avoid sweets as there is a strong link between excessive sugar intake, diabetes, and cognitive impairment We discussed the Mediterranean diet, which has been shown to help patients reduce the risk of progressive memory disorders and reduces cardiovascular risk. This includes eating fish, eat fruits and green leafy vegetables, nuts like almonds and hazelnuts, walnuts, and also use olive oil. Avoid fast foods and fried foods as much as possible. Avoid sweets and sugar as sugar use has been linked to worsening of memory function.  There is always a concern of gradual progression of memory problems. If this is the case, then we may need to adjust level of care according to patient needs. Support, both to the patient and caregiver, should then be put into place.

## 2017-08-18 ENCOUNTER — Encounter: Payer: Self-pay | Admitting: Neurology

## 2017-09-29 ENCOUNTER — Ambulatory Visit: Payer: PPO | Admitting: Neurology

## 2017-12-08 ENCOUNTER — Other Ambulatory Visit (HOSPITAL_COMMUNITY): Payer: Self-pay | Admitting: *Deleted

## 2017-12-09 ENCOUNTER — Ambulatory Visit (HOSPITAL_COMMUNITY)
Admission: RE | Admit: 2017-12-09 | Discharge: 2017-12-09 | Disposition: A | Discharge status: Home / Self Care | Payer: Medicare Other | Source: Ambulatory Visit | Attending: Endocrinology | Admitting: Endocrinology

## 2017-12-09 DIAGNOSIS — M810 Age-related osteoporosis without current pathological fracture: Secondary | ICD-10-CM | POA: Diagnosis present

## 2017-12-09 MED ORDER — ZOLEDRONIC ACID 5 MG/100ML IV SOLN
INTRAVENOUS | Status: AC
  Administered 2017-12-09: 13:00:00 5 mg via INTRAVENOUS
  Filled 2017-12-09: qty 100

## 2017-12-09 MED ORDER — ZOLEDRONIC ACID 5 MG/100ML IV SOLN
5.0000 mg | Freq: Once | INTRAVENOUS | Status: AC
  Administered 2017-12-09: 5 mg via INTRAVENOUS

## 2018-02-09 ENCOUNTER — Encounter: Payer: Self-pay | Admitting: Neurology

## 2018-02-09 ENCOUNTER — Ambulatory Visit (INDEPENDENT_AMBULATORY_CARE_PROVIDER_SITE_OTHER): Payer: Medicare Other | Admitting: Neurology

## 2018-02-09 ENCOUNTER — Other Ambulatory Visit: Payer: Self-pay

## 2018-02-09 VITALS — BP 122/80 | HR 81 | Ht 59.5 in | Wt 157.0 lb

## 2018-02-09 DIAGNOSIS — R441 Visual hallucinations: Secondary | ICD-10-CM | POA: Diagnosis not present

## 2018-02-09 DIAGNOSIS — F0391 Unspecified dementia with behavioral disturbance: Secondary | ICD-10-CM

## 2018-02-09 DIAGNOSIS — G319 Degenerative disease of nervous system, unspecified: Secondary | ICD-10-CM | POA: Diagnosis not present

## 2018-02-09 DIAGNOSIS — F03B18 Unspecified dementia, moderate, with other behavioral disturbance: Secondary | ICD-10-CM

## 2018-02-09 MED ORDER — DIVALPROEX SODIUM 125 MG PO CSDR: DELAYED_RELEASE_CAPSULE | ORAL | 11 refills | Status: AC
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
## 2018-02-09 NOTE — Patient Instructions (Addendum)
1. Refer to Dr. Casimiro Needle for visual hallucinations 2. Increase Depakote 125mg : take 2 in AM, 3 in PM 3. Continue Seroquel 4. Continue 24/7 care 5. Follow-up in 6 months, call for any changes

## 2018-04-02 IMAGING — MR MR HEAD W/O CM
8 series · 43 of 48 positions shown · non-contrast
Comparison: Brain MRI 03/20/2013.

CLINICAL DATA: 65-year-old female with moderate dementia. Visual
changes, Left homonymous hemianopsia. Weakness and difficulty
walking. Possible stroke. Initial encounter.

EXAM:
MRI HEAD WITHOUT CONTRAST
TECHNIQUE: Multiplanar, multiecho pulse sequences of the brain and surrounding
structures were obtained without intravenous contrast.

[Series 2: T1 · sagittal · 5.0mm · 0.45mm/px · 2 of 21 slices shown]
[im 1/21]
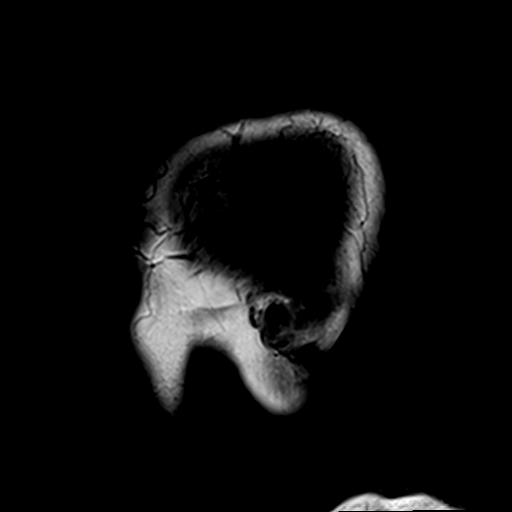
[im 21/21]
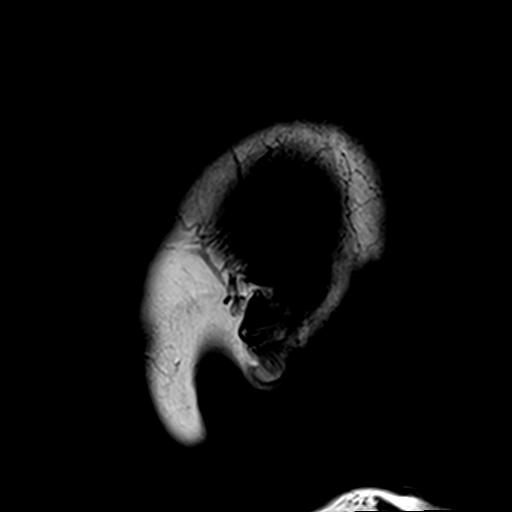

[Series 3: DWI · axial · 3.0mm · 1.80mm/px · z∈[+0,+147]mm · 11 of 99 slices shown (1 of 2)]
[im 1/99]
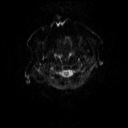
[im 10/99]
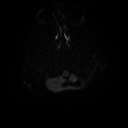
[im 20/99]
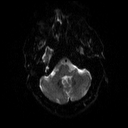
[im 30/99]
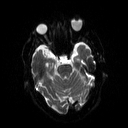
[im 40/99]
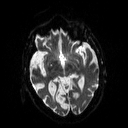
[im 50/99]
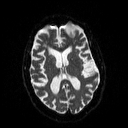
[im 59/99]
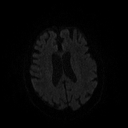
[im 69/99]
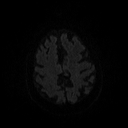
[im 79/99]
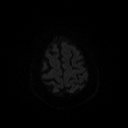
[im 89/99]
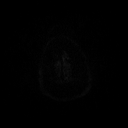
[im 99/99]
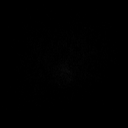

[Series 4: DWI · axial · 3.0mm · 1.80mm/px · z∈[+0,+147]mm · 6 of 50 slices shown (2 of 2)]
[im 1/50]
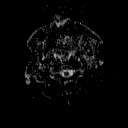
[im 10/50]
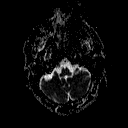
[im 20/50]
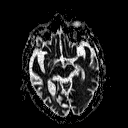
[im 30/50]
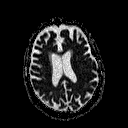
[im 40/50]
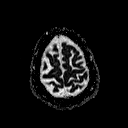
[im 50/50]
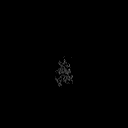

[Series 5: T2 · axial · 5.0mm · 0.51mm/px · z∈[+3,+145]mm · 3 of 22 slices shown (1 of 2)]
[im 1/22]
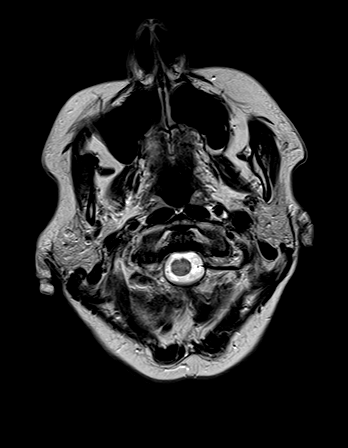
[im 11/22]
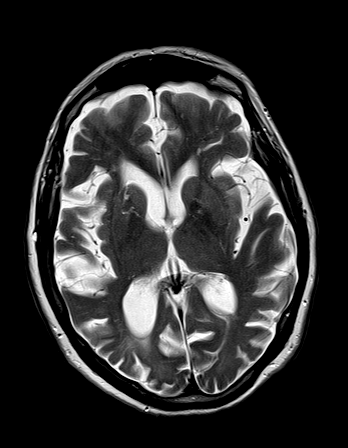
[im 22/22]
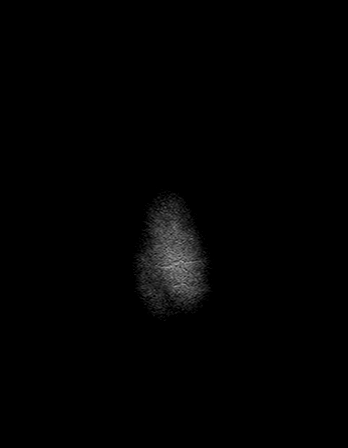

[Series 6: FLAIR · axial · 5.0mm · 0.45mm/px · z∈[+3,+145]mm · 3 of 22 slices shown]
[im 1/22]
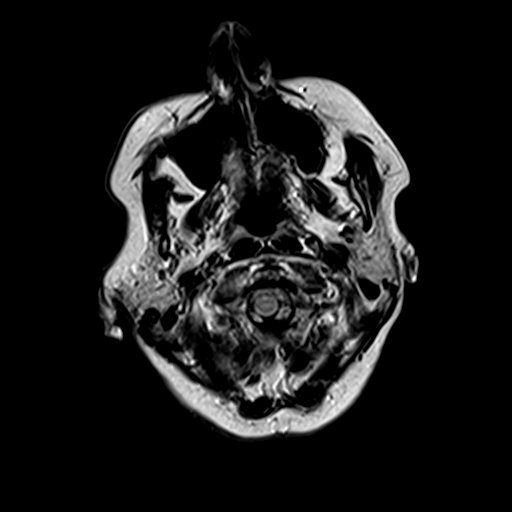
[im 11/22]
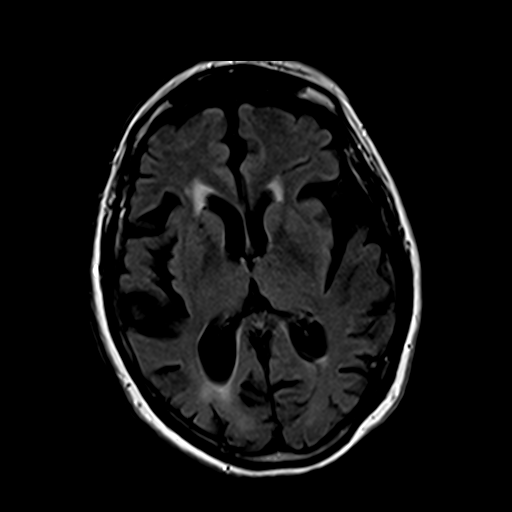
[im 22/22]
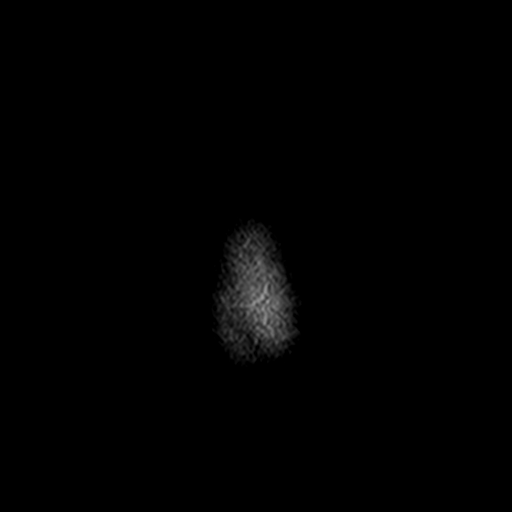

[Series 8: swi_images · axial · 2.0mm · 0.90mm/px · z∈[-5,+153]mm · 8 of 80 slices shown]
[im 1/80]
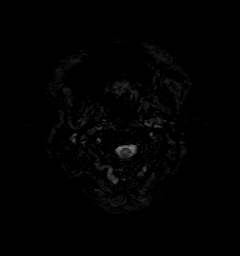
[im 9/80]
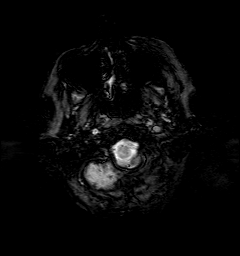
[im 27/80]
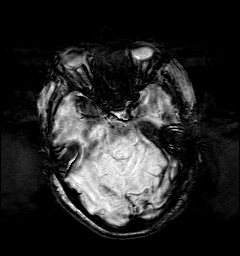
[im 36/80]
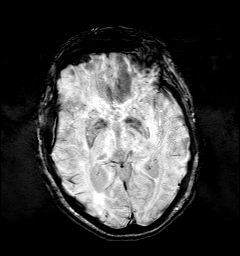
[im 44/80]
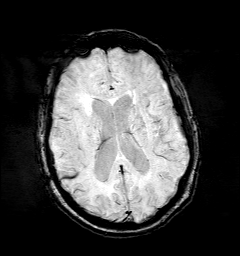
[im 53/80]
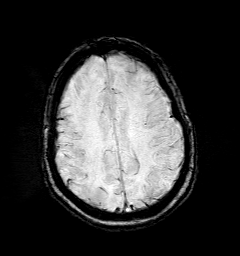
[im 71/80]
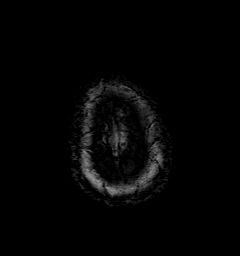
[im 80/80]
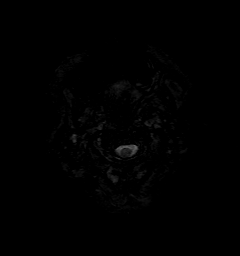

[Series 9: t1_mpr_tra · axial · 2.0mm · 0.45mm/px · z∈[-5,+135]mm · 7 of 80 slices shown]
[im 1/80]
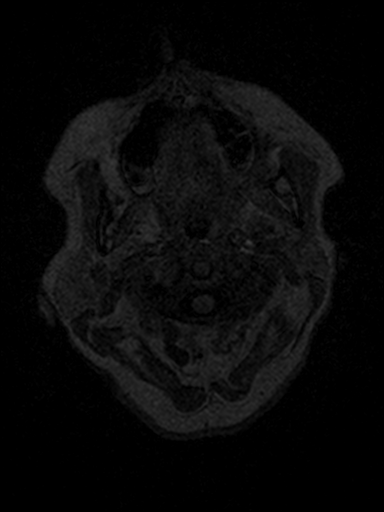
[im 9/80]
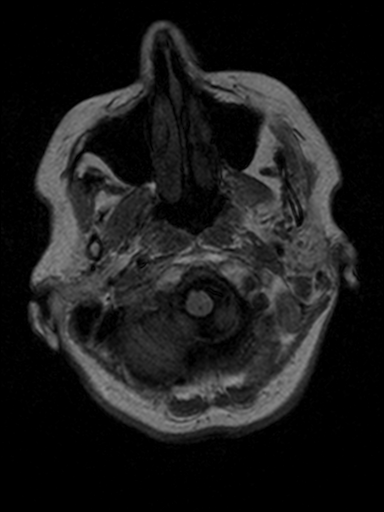
[im 27/80]
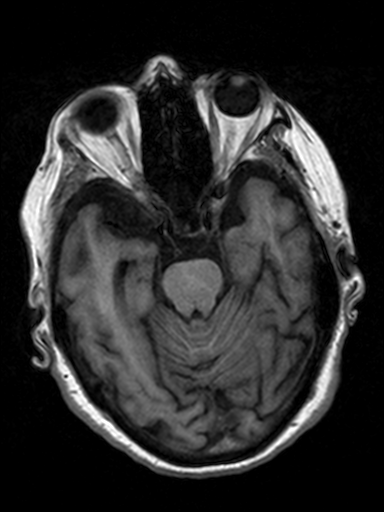
[im 36/80]
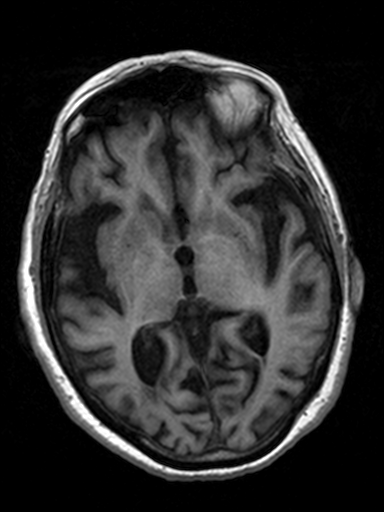
[im 44/80]
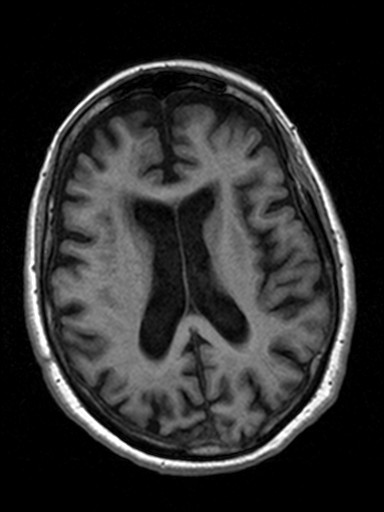
[im 53/80]
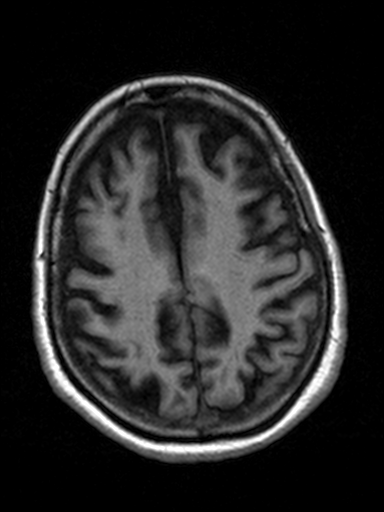
[im 71/80]
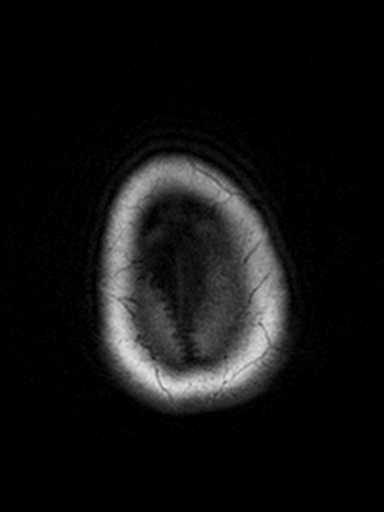

[Series 10: T2 · coronal · 5.0mm · 0.45mm/px · 3 of 28 slices shown (2 of 2)]
[im 1/28]
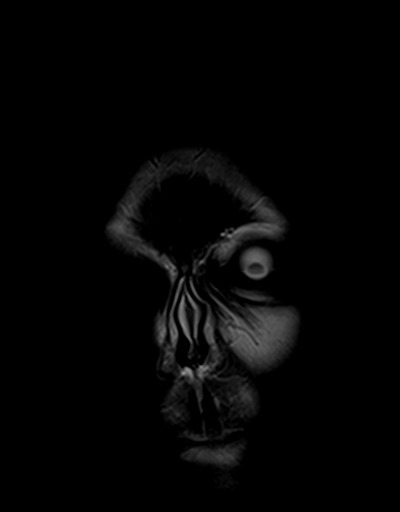
[im 14/28]
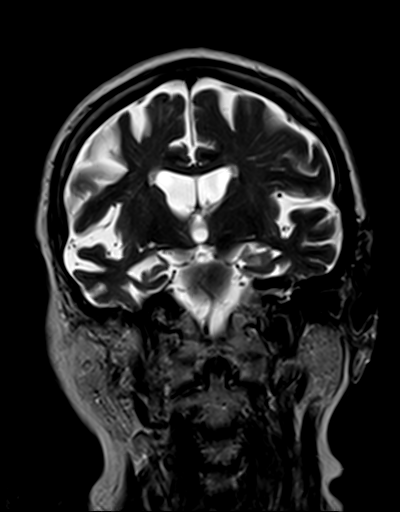
[im 28/28]
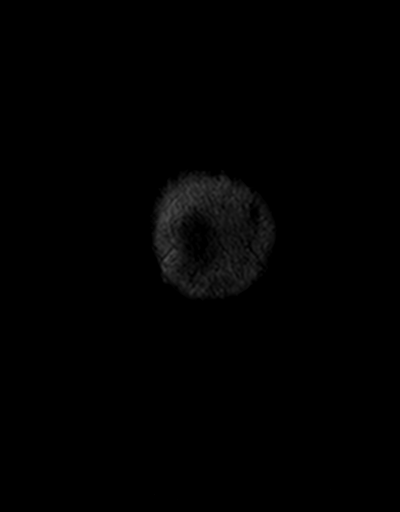

[43 of 48 positions shown; findings below may reference images not displayed]

FINDINGS: Brain: Generalized cerebral volume loss, with some further
progression since 7290. No disproportionate areas of cerebral
atrophy identified; the posterior fossa volume appears better
maintained.

No restricted diffusion to suggest acute infarction. No midline
shift, mass effect, evidence of mass lesion, ventriculomegaly,
extra-axial collection or acute intracranial hemorrhage.
Cervicomedullary junction and pituitary are within normal limits.

Stable gray and white matter signal throughout the brain. Mild
periventricular and scattered anterior frontal lobe nonspecific
white matter T2 and FLAIR hyperintensity. No cortical
encephalomalacia. No chronic cerebral blood products.

Vascular: Major intracranial vascular flow voids are stable and
within normal limits, dominant appearing distal left vertebral
artery. Pneumatized left anterior clinoid process suspected.

Skull and upper cervical spine: Chronic cervical spine disc and
endplate degeneration. Normal bone marrow signal.

Sinuses/Orbits: Bilateral orbits soft tissues appear normal. Optic
nerves and optic chiasm appear within normal limits. No definite
optic radiation lesion.

Visualized paranasal sinuses and mastoids are stable and well
pneumatized.

Other: Visible internal auditory structures appear normal. Negative
scalp soft tissues.
IMPRESSION: 1.  No acute intracranial abnormality.
2. Stable noncontrast MRI appearance of the brain since 7290 aside
from some progression of generalized cerebral volume loss. No
explanation for Left homonymous hemianopsia.

## 2018-05-23 ENCOUNTER — Encounter: Payer: Self-pay | Admitting: Internal Medicine

## 2018-05-23 ENCOUNTER — Non-Acute Institutional Stay: Payer: Medicare Other | Admitting: Internal Medicine

## 2018-05-23 VITALS — BP 104/64 | HR 70 | Resp 18 | Ht 59.0 in | Wt 147.1 lb

## 2018-05-23 DIAGNOSIS — F03B18 Unspecified dementia, moderate, with other behavioral disturbance: Secondary | ICD-10-CM

## 2018-05-23 DIAGNOSIS — F0391 Unspecified dementia with behavioral disturbance: Secondary | ICD-10-CM

## 2018-05-23 NOTE — Progress Notes (Signed)
PALLIATIVE CARE CONSULT VISIT   PATIENT NAME: Colleen Lamb DOB: Jun 25, 1951 MRN: [id number]  PRIMARY CARE PROVIDER:   Seward Carol, MD  REFERRING PROVIDER:  Seward Carol, MD [address] [place], [place] [address]  RESPONSIBLE PARTY:  Sister Colleen Lamb is patients HCPOA. [phone])    RECOMMENDATIONS and PLAN:  1. Advanced Care Planning: I spoke on the phone with patient's HCPOA, her sister Colleen Lamb, who confirmed patient's DNR status (form is on the chart). We discussed MOST form and Colleen Lamb is interested in meeting with me to review and complete this form.             A. Ongoing evaluation for hospice eligibility as decreased functional decline and weight loss.   2. Dementia with behavioral disturbances:             A. Medical regimen as prescribed.              3.Follow up: NP to meet with Colleen Lamb to review and complete MOST form.   I spent 75 minutes providing this consultation,  from 11am to 12:10pm. More than 50% of the time in this consultation was spent coordinating communication.   HISTORY OF PRESENT ILLNESS: 67 yo female with dementia and associated behavioral disturbances. 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  Pneumonia (Feb 2019), UTI (May 2019). Referred to Palliative Care for assistance with symptom management, help with coordination of resources, and ongoing discussions regarding goals of care/advanced directives  CODE STATUS: DNR  ASSESSMENT / REVIEW OF SYMPTOMS: Patient's PPs is 30%. She is dependent for transfers, hygiene, dressing, and needs assist with feeding. She is incontinent of bowel and bladder. Patient has been experiencing progressive memory loss over 5 years. She has been a resident of [facility] for 2 years. Patient has loss of vision thought r/t her dementia and associated visual processing defecits. She may be able to see shadows. She is alert to self only. She is almost constantly agitated  with rambling, garbled, and nonsensical speech. She will answer some questions appropriately in a few word responses. She does not initiate conversations. She has visual hallucinations and delusions. Staff report she usually will spit her pills out or throw them across the room. She often refuses to eat, stating that the food is stale and not fresh, or that it is covered in blood. Her affect and compliance are all improved if she takes her scheduled medications. Current regimen is Seroquel 25mg  qam & 50mg  qhs, Depakote 250mg  qam & 375mg  qhs, celexia 10 mg qd. She was recently started on an Ativan gel bid and prn. Staff report patient is verbally abusive with personal care. She is resistant to getting out of bed and is essentially bedbound. She is not able to sit up independently. She has h/o back pain but this has improved as her mobility has declined. Patient can manage eating finger food, but otherwise needs to be fed as she can't manage eating utensils. She may refuse to eat. Her current weight is 147.1lbs which is down 9.9lbs (6.3% of body weight) over last 3 months.  HOSPICE ELIGIBILITY/DIAGNOSIS: pending progressive weight loss/progressive functional decline.  PAST MEDICAL HISTORY:  Past Medical History:  Diagnosis Date  . Memory loss     SOCIAL HX:  Social History   Tobacco Use  . Smoking status: Never Smoker  . Smokeless tobacco: Never Used  Substance Use Topics  . Alcohol use: Yes    ALLERGIES: No Known Allergies  PERTINENT MEDICATIONS:  Outpatient Encounter Medications as of 05/23/2018  Medication Sig  . atorvastatin (LIPITOR) 10 MG tablet Take 10 mg by mouth daily.  . Cholecalciferol (VITAMIN D) 2000 UNITS tablet Take 4,000 Units by mouth daily.   . citalopram (CELEXA) 10 MG tablet Take 1 tablet (10 mg total) by mouth daily.  . divalproex (DEPAKOTE SPRINKLE) 125 MG capsule Take 2 caps in AM, 3 caps in PM  . donepezil (ARICEPT) 10 MG tablet Take 10 mg by mouth daily.  .  QUEtiapine (SEROQUEL) 100 MG tablet Take 100 mg by mouth at bedtime.  Marland Kitchen QUEtiapine (SEROQUEL) 25 MG tablet Take 25 mg by mouth 2 (two) times daily.  . ranitidine (ZANTAC) 150 MG tablet Take 150 mg by mouth 2 (two) times daily.   No facility-administered encounter medications on file as of 05/23/2018.     PHYSICAL EXAM:  VS: 127 systolic, HR 70, Sat 51% room air, RR18 Middle aged Caucasian female laying 40 degree elevation in bed. Ate only few bites of lunch. Constant rambling garbled and nonsensical speech. Alert and following some simple commands (deep breath). Refusing to eat because believes that the food is "stale/not fresh" and covered in blood. Staring off to left and not turning head towards me or establishing eye contact. Cardiovascular: regular rate and rhythm Pulmonary: clear ant fields Abdomen: soft, nontender, + bowel sounds GU: no suprapubic tenderness Extremities: no edema, no joint deformities. Distal pulses intact. Skin: no rashes Neurological: generalized weakness  Colleen Handler, NP

## 2018-05-25 ENCOUNTER — Ambulatory Visit (HOSPITAL_COMMUNITY): Payer: Medicare Other | Admitting: Psychiatry

## 2018-06-14 ENCOUNTER — Non-Acute Institutional Stay: Payer: Medicare Other | Admitting: Primary Care

## 2018-06-14 DIAGNOSIS — R413 Other amnesia: Secondary | ICD-10-CM

## 2018-06-14 DIAGNOSIS — F0391 Unspecified dementia with behavioral disturbance: Secondary | ICD-10-CM

## 2018-06-14 DIAGNOSIS — F03B18 Unspecified dementia, moderate, with other behavioral disturbance: Secondary | ICD-10-CM

## 2018-06-14 NOTE — Progress Notes (Signed)
    PALLIATIVE CARE CONSULT VISIT   PATIENT NAME: Colleen Lamb DOB: 29-Apr-1951 MRN: [id number]  PRIMARY CARE PROVIDER:   Seward Carol, MD  REFERRING PROVIDER:  Seward Carol, MD [address] [place], [place] [address]  RESPONSIBLE PARTY:    Colleen Lamb is patients HCPOA. [phone])   ASSESSMENT:    Colleen Lamb is awake and pleasant and interacts well, answering questions appropriately.  She appears WNWD. Denies pain, falls, hallucinations. States she is comfortable and happy at this time.  Unable to find weigh recorded per staff. Staff do not report episodes of combative behavior.    RECOMMENDATIONS and PLAN:  1 Goals of Care: DNR on record. Reached Colleen Lamb who will look at a sample MOST form and make the determination to complete one. NP to call Colleen to meet at [facility] on next visit. Return 2-3 months.  I spent 15 minutes providing this consultation,  from 1040 to 1055. More than 50% of the time in this consultation was spent coordinating communication.   HISTORY OF PRESENT ILLNESS:  Colleen Lamb is a 67 y.o. year old female with multiple medical problems including dementia with behavior disturbances. Palliative Care was asked to help address goals of care.   CODE STATUS: DNR  PPS: 30% HOSPICE ELIGIBILITY/DIAGNOSIS: TBD  PAST MEDICAL HISTORY:  Past Medical History:  Diagnosis Date  . Memory loss     SOCIAL HX:  Social History   Tobacco Use  . Smoking status: Never Smoker  . Smokeless tobacco: Never Used  Substance Use Topics  . Alcohol use: Yes    ALLERGIES: No Known Allergies   PERTINENT MEDICATIONS:  Outpatient Encounter Medications as of 06/14/2018  Medication Sig  . atorvastatin (LIPITOR) 10 MG tablet Take 10 mg by mouth daily.  . Cholecalciferol (VITAMIN D) 2000 UNITS tablet Take 4,000 Units by mouth daily.   . citalopram (CELEXA) 10 MG tablet Take 1 tablet (10 mg total) by mouth daily.  . divalproex (DEPAKOTE SPRINKLE) 125  MG capsule Take 2 caps in AM, 3 caps in PM  . donepezil (ARICEPT) 10 MG tablet Take 10 mg by mouth daily.  . QUEtiapine (SEROQUEL) 100 MG tablet Take 100 mg by mouth at bedtime.  Marland Kitchen QUEtiapine (SEROQUEL) 25 MG tablet Take 25 mg by mouth 2 (two) times daily.  . ranitidine (ZANTAC) 150 MG tablet Take 150 mg by mouth 2 (two) times daily.   No facility-administered encounter medications on file as of 06/14/2018.     PHYSICAL EXAM:   VS 98.2-83-18 134/72 unable to assess PO2 General: NAD, frail appearing, cooperative Cardiovascular: regular rate and rhythm, S1S2,  Pulmonary: clear all lung  fields Abdomen: soft, nontender, + bowel sounds, denies constipatoin GU: incontinent Extremities: no edema, no joint deformities Skin: no rashes, lesions, wounds Neurological: Weakness but otherwise nonfocal, denies falls and seizures  Colleen Oaks, NP

## 2018-07-21 ENCOUNTER — Non-Acute Institutional Stay: Payer: Medicare Other | Admitting: Primary Care

## 2018-07-21 DIAGNOSIS — Z515 Encounter for palliative care: Secondary | ICD-10-CM

## 2018-07-21 DIAGNOSIS — F03B18 Unspecified dementia, moderate, with other behavioral disturbance: Secondary | ICD-10-CM

## 2018-07-21 DIAGNOSIS — F0391 Unspecified dementia with behavioral disturbance: Secondary | ICD-10-CM

## 2018-07-21 NOTE — Progress Notes (Signed)
[facility] Telephone: [phone] Fax: [phone]  PATIENT NAME: Colleen Lamb DOB: 29-Jan-1951 MRN: [id number]  PRIMARY CARE PROVIDER:   Seward Carol, MD  REFERRING PROVIDER:  Seward Carol, MD [address] [place], [place] [address]  RESPONSIBLE PARTY:    [name] ASSESSMENT and RECOMMENDATIONS :   1.Goals of care: Sister and POA signed MOST form with DNR, scope is  comfort care,  limited IV and antibiotic therapy, no feeding tube. Patient has declined and sister states poor quality of life. She states that Chyanna would want to be kept comfortable. Signed MOST with DNAR, DNR, do not hospitalized, comfort care and limited antibiotics and IVs. DNR ([facility]) on file.  Patient is eating less and has lost weight (10 lbs) in 6 months.  I spent 35  minutes providing this consultation,  from 12:00 to 12:35 More than 50% of the time in this consultation was spent coordinating communication.   HISTORY OF PRESENT ILLNESS:  Colleen Lamb is a 67 y.o. year old female with multiple medical problems including dementia. Palliative Care was asked to help address goals of care.   CODE STATUS: DNR,  MOST with limited IV, abx.   PPS: 30% HOSPICE ELIGIBILITY/DIAGNOSIS: TBD  PAST MEDICAL HISTORY:  Past Medical History:  Diagnosis Date  . Memory loss     SOCIAL HX:  Social History   Tobacco Use  . Smoking status: Never Smoker  . Smokeless tobacco: Never Used  Substance Use Topics  . Alcohol use: Yes    ALLERGIES: No Known Allergies   PERTINENT MEDICATIONS:  Outpatient Encounter Medications as of 07/21/2018  Medication Sig  . atorvastatin (LIPITOR) 10 MG tablet Take 10 mg by mouth daily.  . Cholecalciferol (VITAMIN D) 2000 UNITS tablet Take 4,000 Units by mouth daily.   . citalopram (CELEXA) 10 MG tablet Take 1 tablet (10 mg total) by mouth daily.  . divalproex (DEPAKOTE SPRINKLE) 125 MG capsule Take 2 caps in AM, 3 caps in PM  . donepezil  (ARICEPT) 10 MG tablet Take 10 mg by mouth daily.  . QUEtiapine (SEROQUEL) 100 MG tablet Take 100 mg by mouth at bedtime.  Marland Kitchen QUEtiapine (SEROQUEL) 25 MG tablet Take 25 mg by mouth 2 (two) times daily.  . ranitidine (ZANTAC) 150 MG tablet Take 150 mg by mouth 2 (two) times daily.   No facility-administered encounter medications on file as of 07/21/2018.     PHYSICAL EXAM:   98.1-107-18 88/56  95% 149 lb Nov. 10 pound wt loss since 2/19  General: NAD, frail appearing,  Cardiovascular: regular rate and rhythm  S1S2 Pulmonary: clear all fields Abdomen: soft, nontender, + bowel sounds,eating fair. Extremities: no edema, no joint deformities Skin: no rashes, no lesions Neurological: Weakness , dementia, unable to speak today, somnolent.  Cyndia Skeeters DNP AGPCNP-BC

## 2018-08-08 NOTE — Progress Notes (Signed)
Received notice from [facility] that pt has been admitted for end-of-life care.  Attending provider: Dr. Seward Carol

## 2018-09-26 ENCOUNTER — Ambulatory Visit: Payer: Medicare Other | Admitting: Neurology

## 2020-05-01 DEATH — deceased
